# Patient Record
Sex: Female | Born: 1995 | Race: Black or African American | Hispanic: No | Marital: Single | Smoking: Never smoker
Health system: Southern US, Community
[De-identification: ages and names within clinical notes are randomized; demographics above are authoritative.]

## PROBLEM LIST (undated history)

## (undated) DIAGNOSIS — S82142A Displaced bicondylar fracture of left tibia, initial encounter for closed fracture: Secondary | ICD-10-CM

## (undated) DIAGNOSIS — H539 Unspecified visual disturbance: Secondary | ICD-10-CM

## (undated) DIAGNOSIS — O139 Gestational [pregnancy-induced] hypertension without significant proteinuria, unspecified trimester: Secondary | ICD-10-CM

## (undated) DIAGNOSIS — T7840XA Allergy, unspecified, initial encounter: Secondary | ICD-10-CM

## (undated) DIAGNOSIS — S82122A Displaced fracture of lateral condyle of left tibia, initial encounter for closed fracture: Secondary | ICD-10-CM

## (undated) HISTORY — DX: Gestational (pregnancy-induced) hypertension without significant proteinuria, unspecified trimester: O13.9

## (undated) HISTORY — PX: MYRINGOTOMY WITH TUBE PLACEMENT: SHX5663

---
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
## 2016-04-17 IMAGING — RF DG C-ARM 61-120 MIN
1 series · 4 of 4 positions shown · non-contrast
Comparison: Left knee series of November 04, 2014.

CLINICAL DATA: ORIF for tibial plateau fracture. Fluoro time
reported as 46 seconds

EXAM:
DG C-ARM 61-120 MIN; LEFT KNEE - 1-2 VIEW

[Series 1: run · 4 of 4 slices shown]
[im 1/4]
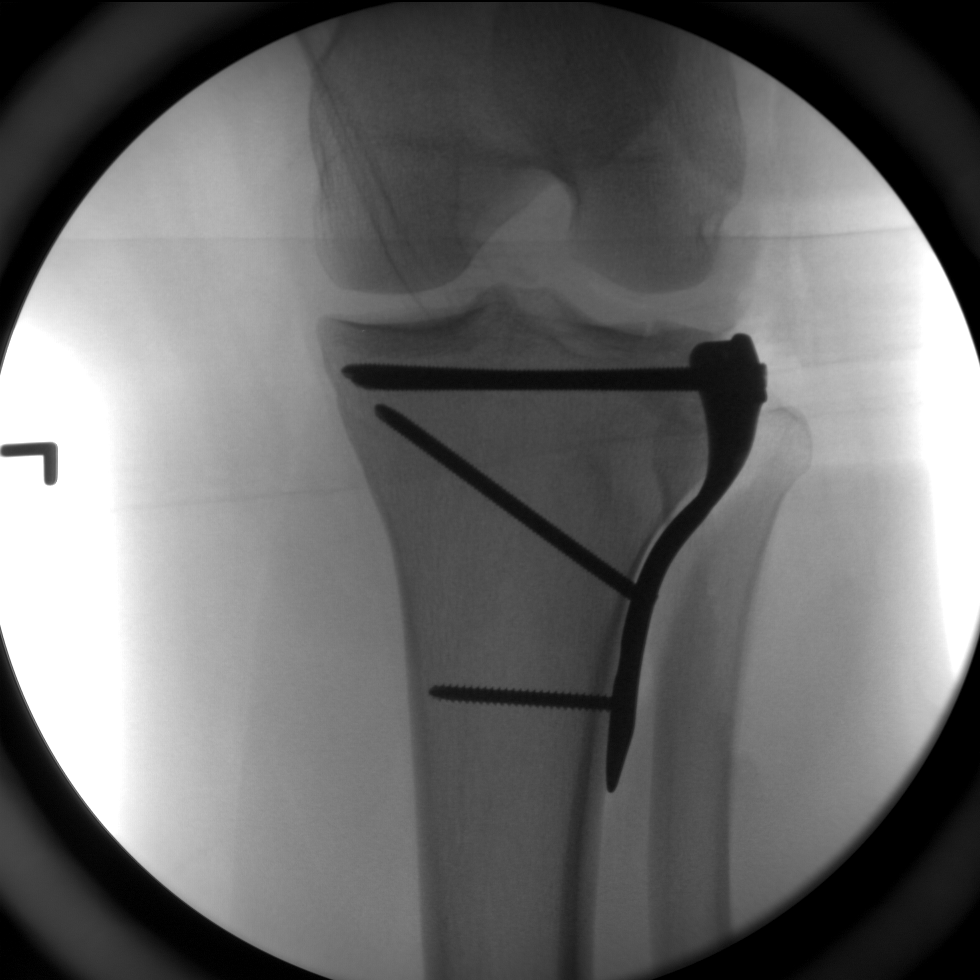
[im 2/4]
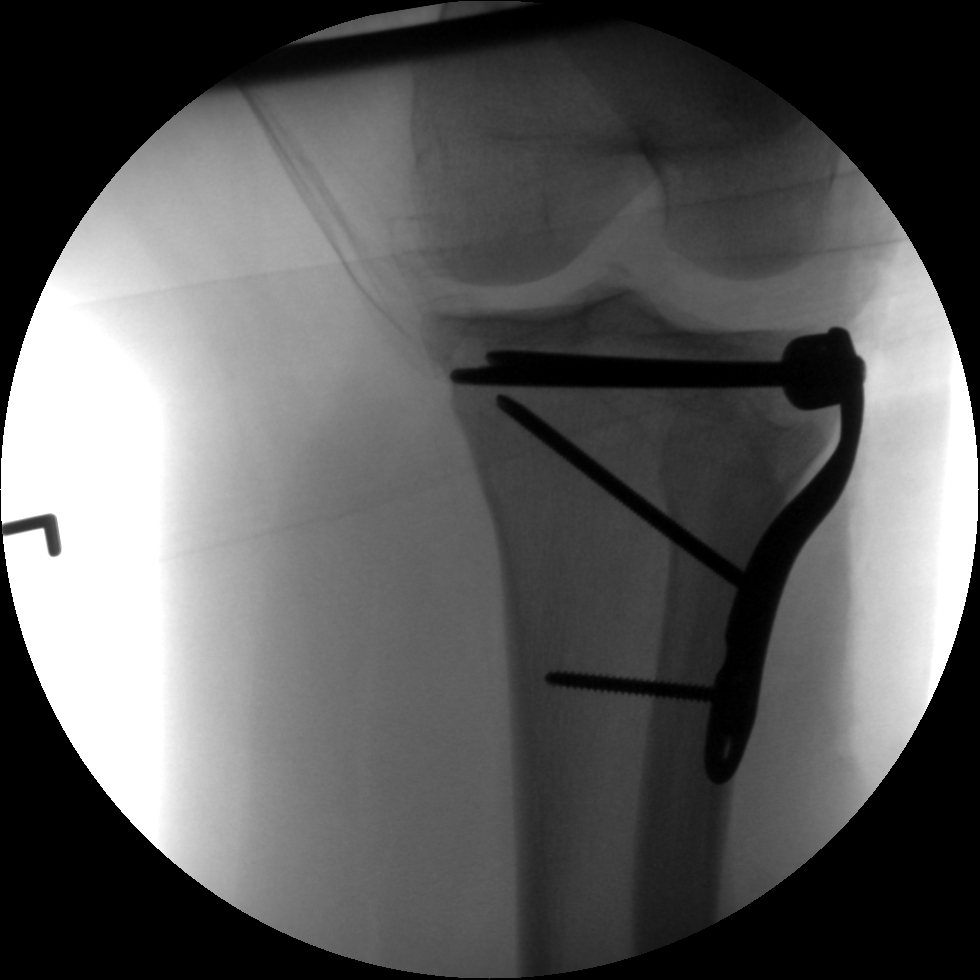
[im 3/4]
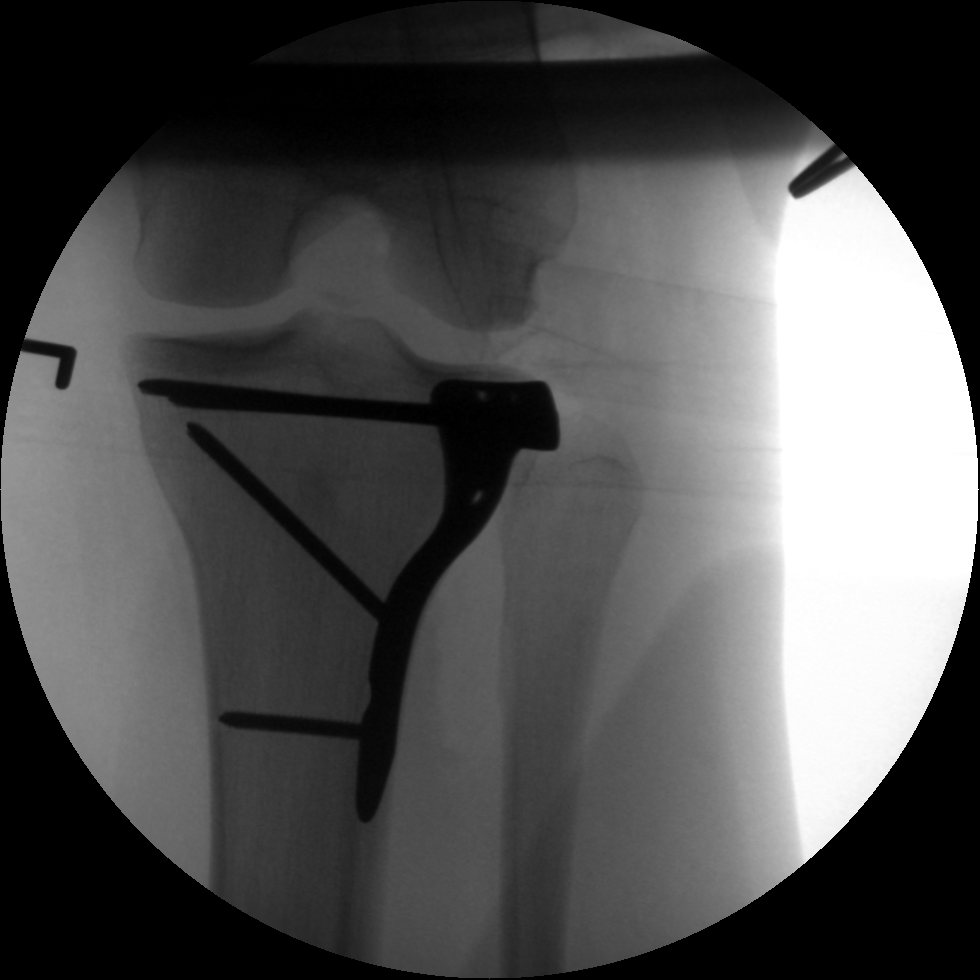
[im 4/4]
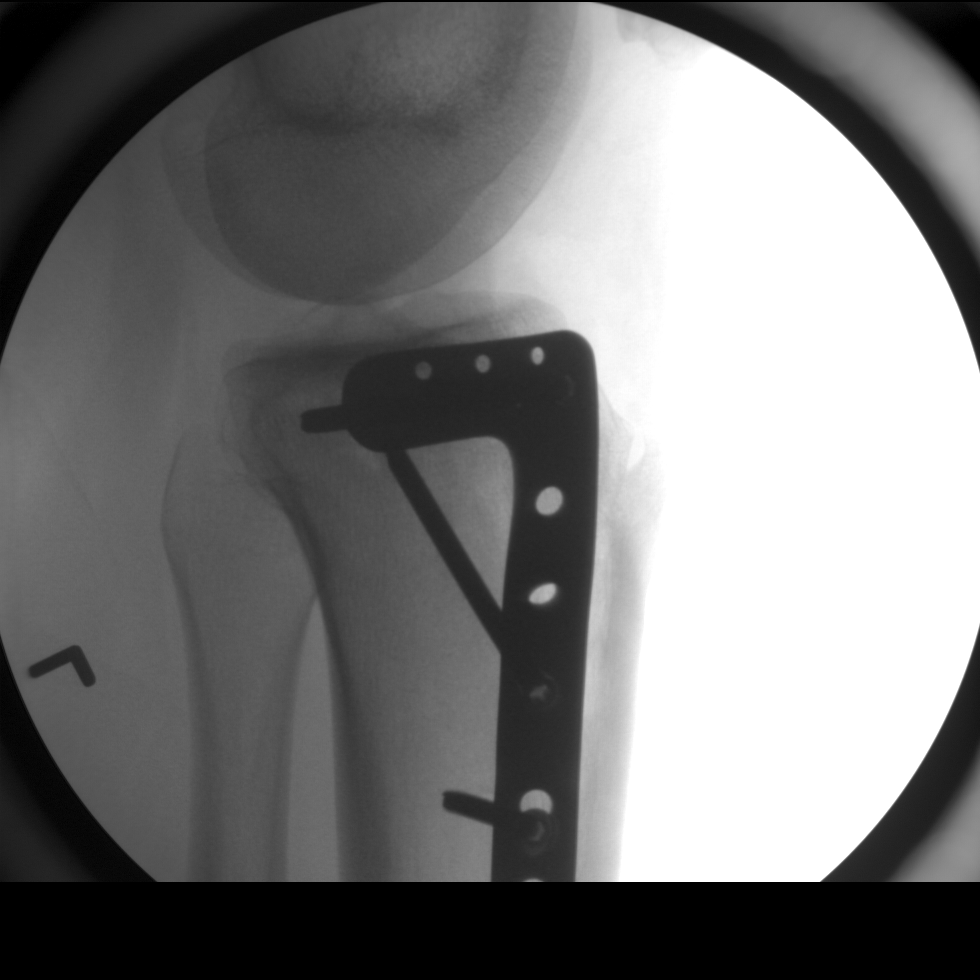

[4 of 4 positions shown; findings below may reference images not displayed]

FINDINGS: Four fluorospot images reveal the patient to have undergone ORIF for
a lateral tibial plateau fracture. Elevation of the tibial plateau
is near anatomic. The metallic hardware is in appropriate position
radiographically.
IMPRESSION: The patient has undergone ORIF for a lateral tibial plateau
fracture. There is no immediate postprocedure complication.

## 2016-08-26 ENCOUNTER — Emergency Department (HOSPITAL_COMMUNITY)
Admission: EM | Admit: 2016-08-26 | Discharge: 2016-08-26 | Disposition: A | Discharge status: Home / Self Care | Payer: Medicaid Other | Attending: Emergency Medicine | Admitting: Emergency Medicine

## 2016-08-26 ENCOUNTER — Encounter (HOSPITAL_COMMUNITY): Payer: Self-pay

## 2016-08-26 DIAGNOSIS — L509 Urticaria, unspecified: Secondary | ICD-10-CM | POA: Insufficient documentation

## 2016-08-26 DIAGNOSIS — Z79899 Other long term (current) drug therapy: Secondary | ICD-10-CM | POA: Insufficient documentation

## 2016-08-26 MED ORDER — DIPHENHYDRAMINE HCL 25 MG PO CAPS
25.0000 mg | ORAL_CAPSULE | Freq: Once | ORAL | Status: AC
  Administered 2016-08-26: 25 mg via ORAL
  Filled 2016-08-26: qty 1

## 2016-08-26 MED ORDER — DIPHENHYDRAMINE HCL 25 MG PO TABS: 25.0000 mg | ORAL_TABLET | Freq: Four times a day (QID) | ORAL | 0 refills | Status: DC | PRN

## 2016-08-26 MED ORDER — HYDROCORTISONE 2.5 % EX LOTN: TOPICAL_LOTION | Freq: Two times a day (BID) | CUTANEOUS | 1 refills | Status: DC

## 2016-08-26 MED ORDER — HYDROXYZINE HCL 10 MG PO TABS: 10.0000 mg | ORAL_TABLET | Freq: Four times a day (QID) | ORAL | 0 refills | Status: DC | PRN

## 2016-08-26 NOTE — ED Provider Notes (Signed)
[facility] DEPT Provider Note   CSN: [id number] Arrival date & time: 08/26/16  1152     History   Chief Complaint Chief Complaint  Patient presents with  . Rash    HPI Meghan Burton is a 20 y.o. female.  Meghan Burton is a 20 y.o. Female who presents to the ED complaining of a rash to her bilateral legs since yesterday. Patient reports she first noticed the itchy rash to her bilateral legs yesterday. She reports now that she has some areas to her abdomen and her back. She reports she recently changed her laundry detergent gain about a week ago. She reports the rash is itchy. She is use some hydrocortisone lotion with some relief of the itching. No fevers. No discharge from the site. No changes to her soaps, lotions, perfumes, plants or animals in the home. She denies active with other symptoms at home. No itching between her webspaces. She denies fevers, numbness, tingling, tongue swelling, lip swelling, trouble swallowing, body aches, insect bites, shortness of breath, abdominal pain, nausea, vomiting or coughing.   The history is provided by the patient. No language interpreter was used.  Rash      Past Medical History:  Diagnosis Date  . Allergy    seasonal  . Closed fracture of lateral portion of left tibial plateau 11/13/2014  . Tibial plateau fracture, left   . Vision abnormalities    glasses    Patient Active Problem List   Diagnosis Date Noted  . Closed fracture of lateral portion of left tibial plateau 11/13/2014  . Fracture, tibial plateau 11/13/2014    Past Surgical History:  Procedure Laterality Date  . MYRINGOTOMY WITH TUBE PLACEMENT    . ORIF TIBIA PLATEAU Left 11/13/2014   Procedure: OPEN REDUCTION INTERNAL FIXATION (ORIF) LEFT LATERAL TIBIAL PLATEAU;  Surgeon: Eulas Post, MD;  Location: [facility];  Service: Orthopedics;  Laterality: Left;    OB History    No data available       Home Medications    Prior to Admission  medications   Medication Sig Start Date End Date Taking? Authorizing Provider  baclofen (LIORESAL) 10 MG tablet Take 1 tablet (10 mg total) by mouth 3 (three) times daily. As needed for muscle spasm 11/13/14   Teryl Lucy, MD  diphenhydrAMINE (BENADRYL) 25 MG tablet Take 1 tablet (25 mg total) by mouth every 6 (six) hours as needed for itching (Rash). 08/26/16   Everlene Farrier, PA-C  hydrocortisone 2.5 % lotion Apply topically 2 (two) times daily. 08/26/16   Everlene Farrier, PA-C  hydrOXYzine (ATARAX/VISTARIL) 10 MG tablet Take 1 tablet (10 mg total) by mouth every 6 (six) hours as needed for itching. 08/26/16   Everlene Farrier, PA-C  ondansetron (ZOFRAN) 4 MG tablet Take 1 tablet (4 mg total) by mouth every 8 (eight) hours as needed for nausea or vomiting. 11/13/14   Teryl Lucy, MD  oxyCODONE-acetaminophen (PERCOCET) 10-325 MG per tablet Take 1-2 tablets by mouth every 6 (six) hours as needed for pain. MAXIMUM TOTAL ACETAMINOPHEN DOSE IS 4000 MG PER DAY 11/13/14   Teryl Lucy, MD  sennosides-docusate sodium (SENOKOT-S) 8.6-50 MG tablet Take 2 tablets by mouth daily. 11/13/14   Teryl Lucy, MD    Family History No family history on file.  Social History Social History  Substance Use Topics  . Smoking status: Never Smoker  . Smokeless tobacco: Never Used  . Alcohol use No     Allergies   Review of patient's allergies  indicates no known allergies.   Review of Systems Review of Systems  Constitutional: Negative for fever.  HENT: Negative for drooling, mouth sores, sore throat and trouble swallowing.   Respiratory: Negative for cough, shortness of breath and wheezing.   Gastrointestinal: Negative for abdominal pain, nausea and vomiting.  Skin: Positive for rash.     Physical Exam Updated Vital Signs BP 139/57 (BP Location: Left Arm)   Pulse 111   Temp 98.7 F (37.1 C) (Oral)   Resp 18   Ht 5\' 7"  (1.702 m)   Wt 86.2 kg   LMP 08/10/2016 Comment: implant  SpO2 100%   BMI 29.76  kg/m   Physical Exam  Constitutional: She appears well-developed and well-nourished. No distress.  Nontoxic appearing.  HENT:  Head: Normocephalic and atraumatic.  Right Ear: External ear normal.  Left Ear: External ear normal.  Mouth/Throat: Oropharynx is clear and moist.  No tongue or lip swelling. Uvula is midline without edema. No drooling.  Eyes: Conjunctivae are normal. Pupils are equal, round, and reactive to light. Right eye exhibits no discharge. Left eye exhibits no discharge.  Neck: Neck supple.  Cardiovascular: Normal rate, regular rhythm, normal heart sounds and intact distal pulses.   Heart rate is 88.  Pulmonary/Chest: Effort normal and breath sounds normal. No respiratory distress. She has no wheezes. She has no rales.  Lungs clear auscultation bilaterally.  Abdominal: Soft. There is no tenderness.  Lymphadenopathy:    She has no cervical adenopathy.  Neurological: She is alert. Coordination normal.  Skin: Skin is warm and dry. Capillary refill takes less than 2 seconds. Rash noted. She is not diaphoretic. No erythema. No pallor.  Scattered urticarial rash noted mostly to her bilateral legs. There is a small area on her abdomen and back. No vesicles or bulla. No discharge. No induration. No petechiae or purpura. No target lesions.  Psychiatric: She has a normal mood and affect. Her behavior is normal.  Nursing note and vitals reviewed.    ED Treatments / Results  Labs (all labs ordered are listed, but only abnormal results are displayed) Labs Reviewed - No data to display  EKG  EKG Interpretation None       Radiology No results found.  Procedures Procedures (including critical care time)  Medications Ordered in ED Medications  diphenhydrAMINE (BENADRYL) capsule 25 mg (not administered)     Initial Impression / Assessment and Plan / ED Course  I have reviewed the triage vital signs and the nursing notes.  Pertinent labs & imaging results that  were available during my care of the patient were reviewed by me and considered in my medical decision making (see chart for details).  Clinical Course   This is a 20 y.o. Female who presents to the ED complaining of a rash to her bilateral legs since yesterday. Patient reports she first noticed the itchy rash to her bilateral legs yesterday. She reports now that she has some areas to her abdomen and her back. She reports she recently changed her laundry detergent gain about a week ago. She reports the rash is itchy. She is use some hydrocortisone lotion with some relief of the itching. No fevers. No tongue or lip swelling. No SOB.  On exam the patient is afebrile nontoxic appearing. She has a slight urticarial rash noted to her bilateral legs mostly. Vesicles or bulla. No target lesions. No induration or fluctuance. No itching between her webspaces. Patient with an urticarial rash. I encouraged her to switch back  to her usual laundry detergent. Provided Benadryl in the emergency department. Patient discharged with prescription for Atarax, Benadryl and hydrocortisone lotion. I encouraged her to follow-up with her primary care doctor. I discussed strict return precautions. I advised the patient to follow-up with their primary care provider this week. I advised the patient to return to the emergency department with new or worsening symptoms or new concerns. The patient verbalized understanding and agreement with plan.    Final Clinical Impressions(s) / ED Diagnoses   Final diagnoses:  Urticaria    New Prescriptions New Prescriptions   DIPHENHYDRAMINE (BENADRYL) 25 MG TABLET    Take 1 tablet (25 mg total) by mouth every 6 (six) hours as needed for itching (Rash).   HYDROCORTISONE 2.5 % LOTION    Apply topically 2 (two) times daily.   HYDROXYZINE (ATARAX/VISTARIL) 10 MG TABLET    Take 1 tablet (10 mg total) by mouth every 6 (six) hours as needed for itching.     Everlene FarrierWilliam Lamere Lightner, PA-C 08/26/16  1324    Samuel JesterKathleen McManus, DO 08/27/16 [phone]

## 2016-08-26 NOTE — ED Triage Notes (Addendum)
Pt reports a rash that started on legs yesterday, now has hives to arms, legs and trunk of body. Complains of itching. Denies SOB or throat swelling

## 2016-08-26 NOTE — ED Notes (Signed)
Hives to back, buttock and legs.  Denies any SOB or mouth swelling.  Ate pizza with cheese last night.  Not on any medications except birth control implant to left upper arm, which has been there for one year.  Only changes is laundry detergent in last few days.  Was using Gain but has changed to unknown detergent.

## 2017-06-08 DIAGNOSIS — L910 Hypertrophic scar: Secondary | ICD-10-CM | POA: Diagnosis not present

## 2018-01-10 DIAGNOSIS — Z113 Encounter for screening for infections with a predominantly sexual mode of transmission: Secondary | ICD-10-CM | POA: Diagnosis not present

## 2018-01-10 DIAGNOSIS — Z114 Encounter for screening for human immunodeficiency virus [HIV]: Secondary | ICD-10-CM | POA: Diagnosis not present

## 2018-01-10 DIAGNOSIS — Z3009 Encounter for other general counseling and advice on contraception: Secondary | ICD-10-CM | POA: Diagnosis not present

## 2018-01-10 DIAGNOSIS — Z3041 Encounter for surveillance of contraceptive pills: Secondary | ICD-10-CM | POA: Diagnosis not present

## 2018-05-17 ENCOUNTER — Ambulatory Visit: Payer: 59 | Admitting: Adult Health

## 2018-05-17 ENCOUNTER — Encounter: Payer: Self-pay | Admitting: Adult Health

## 2018-05-17 VITALS — BP 126/80 | HR 92 | Ht 66.0 in | Wt 198.0 lb

## 2018-05-17 DIAGNOSIS — R109 Unspecified abdominal pain: Secondary | ICD-10-CM | POA: Diagnosis not present

## 2018-05-17 DIAGNOSIS — Z3201 Encounter for pregnancy test, result positive: Secondary | ICD-10-CM | POA: Diagnosis not present

## 2018-05-17 DIAGNOSIS — O3680X0 Pregnancy with inconclusive fetal viability, not applicable or unspecified: Secondary | ICD-10-CM

## 2018-05-17 DIAGNOSIS — Z3A01 Less than 8 weeks gestation of pregnancy: Secondary | ICD-10-CM

## 2018-05-17 DIAGNOSIS — N926 Irregular menstruation, unspecified: Secondary | ICD-10-CM

## 2018-05-17 LAB — POCT URINE PREGNANCY: Preg Test, Ur: POSITIVE — AB

## 2018-05-17 MED ORDER — PRENATAL PLUS 27-1 MG PO TABS: 1.0000 | ORAL_TABLET | Freq: Every day | ORAL | 12 refills | Status: DC

## 2018-05-17 NOTE — Patient Instructions (Signed)
First Trimester of Pregnancy The first trimester of pregnancy is from week 1 until the end of week 13 (months 1 through 3). A week after a sperm fertilizes an egg, the egg will implant on the wall of the uterus. This embryo will begin to develop into a baby. Genes from you and your partner will form the baby. The female genes will determine whether the baby will be a boy or a girl. At 6-8 weeks, the eyes and face will be formed, and the heartbeat can be seen on ultrasound. At the end of 12 weeks, all the baby's organs will be formed. Now that you are pregnant, you will want to do everything you can to have a healthy baby. Two of the most important things are to get good prenatal care and to follow your health care provider's instructions. Prenatal care is all the medical care you receive before the baby's birth. This care will help prevent, find, and treat any problems during the pregnancy and childbirth. Body changes during your first trimester Your body goes through many changes during pregnancy. The changes vary from woman to woman.  You may gain or lose a couple of pounds at first.  You may feel sick to your stomach (nauseous) and you may throw up (vomit). If the vomiting is uncontrollable, call your health care provider.  You may tire easily.  You may develop headaches that can be relieved by medicines. All medicines should be approved by your health care provider.  You may urinate more often. Painful urination may mean you have a bladder infection.  You may develop heartburn as a result of your pregnancy.  You may develop constipation because certain hormones are causing the muscles that push stool through your intestines to slow down.  You may develop hemorrhoids or swollen veins (varicose veins).  Your breasts may begin to grow larger and become tender. Your nipples may stick out more, and the tissue that surrounds them (areola) may become darker.  Your gums may bleed and may be  sensitive to brushing and flossing.  Dark spots or blotches (chloasma, mask of pregnancy) may develop on your face. This will likely fade after the baby is born.  Your menstrual periods will stop.  You may have a loss of appetite.  You may develop cravings for certain kinds of food.  You may have changes in your emotions from day to day, such as being excited to be pregnant or being concerned that something may go wrong with the pregnancy and baby.  You may have more vivid and strange dreams.  You may have changes in your hair. These can include thickening of your hair, rapid growth, and changes in texture. Some women also have hair loss during or after pregnancy, or hair that feels dry or thin. Your hair will most likely return to normal after your baby is born.  What to expect at prenatal visits During a routine prenatal visit:  You will be weighed to make sure you and the baby are growing normally.  Your blood pressure will be taken.  Your abdomen will be measured to track your baby's growth.  The fetal heartbeat will be listened to between weeks 10 and 14 of your pregnancy.  Test results from any previous visits will be discussed.  Your health care provider may ask you:  How you are feeling.  If you are feeling the baby move.  If you have had any abnormal symptoms, such as leaking fluid, bleeding, severe headaches,   or abdominal cramping.  If you are using any tobacco products, including cigarettes, chewing tobacco, and electronic cigarettes.  If you have any questions.  Other tests that may be performed during your first trimester include:  Blood tests to find your blood type and to check for the presence of any previous infections. The tests will also be used to check for low iron levels (anemia) and protein on red blood cells (Rh antibodies). Depending on your risk factors, or if you previously had diabetes during pregnancy, you may have tests to check for high blood  sugar that affects pregnant women (gestational diabetes).  Urine tests to check for infections, diabetes, or protein in the urine.  An ultrasound to confirm the proper growth and development of the baby.  Fetal screens for spinal cord problems (spina bifida) and Down syndrome.  HIV (human immunodeficiency virus) testing. Routine prenatal testing includes screening for HIV, unless you choose not to have this test.  You may need other tests to make sure you and the baby are doing well.  Follow these instructions at home: Medicines  Follow your health care provider's instructions regarding medicine use. Specific medicines may be either safe or unsafe to take during pregnancy.  Take a prenatal vitamin that contains at least 600 micrograms (mcg) of folic acid.  If you develop constipation, try taking a stool softener if your health care provider approves. Eating and drinking  Eat a balanced diet that includes fresh fruits and vegetables, whole grains, good sources of protein such as meat, eggs, or tofu, and low-fat dairy. Your health care provider will help you determine the amount of weight gain that is right for you.  Avoid raw meat and uncooked cheese. These carry germs that can cause birth defects in the baby.  Eating four or five small meals rather than three large meals a day may help relieve nausea and vomiting. If you start to feel nauseous, eating a few soda crackers can be helpful. Drinking liquids between meals, instead of during meals, also seems to help ease nausea and vomiting.  Limit foods that are high in fat and processed sugars, such as fried and sweet foods.  To prevent constipation: ? Eat foods that are high in fiber, such as fresh fruits and vegetables, whole grains, and beans. ? Drink enough fluid to keep your urine clear or pale yellow. Activity  Exercise only as directed by your health care provider. Most women can continue their usual exercise routine during  pregnancy. Try to exercise for 30 minutes at least 5 days a week. Exercising will help you: ? Control your weight. ? Stay in shape. ? Be prepared for labor and delivery.  Experiencing pain or cramping in the lower abdomen or lower back is a good sign that you should stop exercising. Check with your health care provider before continuing with normal exercises.  Try to avoid standing for long periods of time. Move your legs often if you must stand in one place for a long time.  Avoid heavy lifting.  Wear low-heeled shoes and practice good posture.  You may continue to have sex unless your health care provider tells you not to. Relieving pain and discomfort  Wear a good support bra to relieve breast tenderness.  Take warm sitz baths to soothe any pain or discomfort caused by hemorrhoids. Use hemorrhoid cream if your health care provider approves.  Rest with your legs elevated if you have leg cramps or low back pain.  If you develop   varicose veins in your legs, wear support hose. Elevate your feet for 15 minutes, 3-4 times a day. Limit salt in your diet. Prenatal care  Schedule your prenatal visits by the twelfth week of pregnancy. They are usually scheduled monthly at first, then more often in the last 2 months before delivery.  Write down your questions. Take them to your prenatal visits.  Keep all your prenatal visits as told by your health care provider. This is important. Safety  Wear your seat belt at all times when driving.  Make a list of emergency phone numbers, including numbers for family, friends, the hospital, and police and fire departments. General instructions  Ask your health care provider for a referral to a local prenatal education class. Begin classes no later than the beginning of month 6 of your pregnancy.  Ask for help if you have counseling or nutritional needs during pregnancy. Your health care provider can offer advice or refer you to specialists for help  with various needs.  Do not use hot tubs, steam rooms, or saunas.  Do not douche or use tampons or scented sanitary pads.  Do not cross your legs for long periods of time.  Avoid cat litter boxes and soil used by cats. These carry germs that can cause birth defects in the baby and possibly loss of the fetus by miscarriage or stillbirth.  Avoid all smoking, herbs, alcohol, and medicines not prescribed by your health care provider. Chemicals in these products affect the formation and growth of the baby.  Do not use any products that contain nicotine or tobacco, such as cigarettes and e-cigarettes. If you need help quitting, ask your health care provider. You may receive counseling support and other resources to help you quit.  Schedule a dentist appointment. At home, brush your teeth with a soft toothbrush and be gentle when you floss. Contact a health care provider if:  You have dizziness.  You have mild pelvic cramps, pelvic pressure, or nagging pain in the abdominal area.  You have persistent nausea, vomiting, or diarrhea.  You have a bad smelling vaginal discharge.  You have pain when you urinate.  You notice increased swelling in your face, hands, legs, or ankles.  You are exposed to fifth disease or chickenpox.  You are exposed to German measles (rubella) and have never had it. Get help right away if:  You have a fever.  You are leaking fluid from your vagina.  You have spotting or bleeding from your vagina.  You have severe abdominal cramping or pain.  You have rapid weight gain or loss.  You vomit blood or material that looks like coffee grounds.  You develop a severe headache.  You have shortness of breath.  You have any kind of trauma, such as from a fall or a car accident. Summary  The first trimester of pregnancy is from week 1 until the end of week 13 (months 1 through 3).  Your body goes through many changes during pregnancy. The changes vary from  woman to woman.  You will have routine prenatal visits. During those visits, your health care provider will examine you, discuss any test results you may have, and talk with you about how you are feeling. This information is not intended to replace advice given to you by your health care provider. Make sure you discuss any questions you have with your health care provider. Document Released: 10/20/2001 Document Revised: 10/07/2016 Document Reviewed: 10/07/2016 Elsevier Interactive Patient Education  2018 Elsevier   Inc.  

## 2018-05-17 NOTE — Progress Notes (Signed)
  Subjective:     Patient ID: Meghan Burton, female   DOB: February 19, 1996, 22 y.o.   MRN: [id number]  HPI Meghan Burton is a 22 year old black female in for UPT, has missed a period and had 2+HPTs.   Review of Systems +missed period with 2+HPTs ,cramping Reviewed past medical,surgical, social and family history. Reviewed medications and allergies.     Objective:   Physical Exam BP 126/80 (BP Location: Left Arm, Patient Position: Sitting, Cuff Size: Normal)   Pulse 92   Ht 5\' 6"  (1.676 m)   Wt 198 lb (89.8 kg)   LMP 04/13/2018   BMI 31.96 kg/m UPT + about 4+6 weeks by LMP with EDD 01/19/19.Skin warm and dry. Neck: mid line trachea, normal thyroid, good ROM, no lymphadenopathy noted. Lungs: clear to ausculation bilaterally. Cardiovascular: regular rate and rhythm. Abdomen is soft and non tender.PHQ 2 score 0.     Assessment:     1. Pregnancy examination or test, positive result   2. Less than [redacted] weeks gestation of pregnancy   3. Encounter to determine fetal viability of pregnancy, single or unspecified fetus       Plan:     Meds ordered this encounter  Medications  . prenatal vitamin w/FE, FA (PRENATAL 1 + 1) 27-1 MG TABS tablet    Sig: Take 1 tablet by mouth daily at 12 noon.    Dispense:  30 each    Refill:  12    Order Specific Question:   Supervising Provider    Answer:   Lazaro ArmsEURE, LUTHER H [[id number]]  Return in 2 weeks for dating US Review handouts on [facility] trimester and by [facility]

## 2018-05-18 NOTE — Progress Notes (Signed)
This patient [facility] not [facility] and I am not familiar with her personally.

## 2018-05-20 ENCOUNTER — Other Ambulatory Visit: Payer: Self-pay

## 2018-05-20 ENCOUNTER — Encounter: Payer: Self-pay | Admitting: Obstetrics & Gynecology

## 2018-05-20 ENCOUNTER — Ambulatory Visit: Payer: Medicaid Other | Admitting: Obstetrics & Gynecology

## 2018-05-20 VITALS — BP 129/70 | HR 95 | Ht 66.0 in | Wt 198.0 lb

## 2018-05-20 DIAGNOSIS — O200 Threatened abortion: Secondary | ICD-10-CM | POA: Diagnosis not present

## 2018-05-20 NOTE — Progress Notes (Signed)
Chief Complaint  Patient presents with  . Vaginal Bleeding    abd cramping      22 y.o. G1P0 Patient's last menstrual period was 04/13/2018. The current method of family planning is pt is pregnant.  Outpatient Encounter Medications as of 05/20/2018  Medication Sig  . prenatal vitamin w/FE, FA (PRENATAL 1 + 1) 27-1 MG TABS tablet Take 1 tablet by mouth daily at 12 noon.   No facility-administered encounter medications on file as of 05/20/2018.     Subjective Meghan Burton present stating she has had some spotting and cramping since yesterday Nothing heavy like a period and the cramps are menstrual like  Past Medical History:  Diagnosis Date  . Allergy    seasonal  . Closed fracture of lateral portion of left tibial plateau 11/13/2014  . Tibial plateau fracture, left   . Vision abnormalities    glasses    Past Surgical History:  Procedure Laterality Date  . MYRINGOTOMY WITH TUBE PLACEMENT    . ORIF TIBIA PLATEAU Left 11/13/2014   Procedure: OPEN REDUCTION INTERNAL FIXATION (ORIF) LEFT LATERAL TIBIAL PLATEAU;  Surgeon: Eulas Post, MD;  Location: [facility];  Service: Orthopedics;  Laterality: Left;    OB History    Gravida  1   Para      Term      Preterm      AB      Living        SAB      TAB      Ectopic      Multiple      Live Births              No Known Allergies  Social History   Socioeconomic History  . Marital status: Single    Spouse name: Not on file  . Number of children: Not on file  . Years of education: Not on file  . Highest education level: Not on file  Occupational History  . Not on file  Social Needs  . Financial resource strain: Not on file  . Food insecurity:    Worry: Not on file    Inability: Not on file  . Transportation needs:    Medical: Not on file    Non-medical: Not on file  Tobacco Use  . Smoking status: Never Smoker  . Smokeless tobacco: Never Used  Substance and Sexual  Activity  . Alcohol use: No  . Drug use: No  . Sexual activity: Not Currently    Birth control/protection: None  Lifestyle  . Physical activity:    Days per week: Not on file    Minutes per session: Not on file  . Stress: Not on file  Relationships  . Social connections:    Talks on phone: Not on file    Gets together: Not on file    Attends religious service: Not on file    Active member of club or organization: Not on file    Attends meetings of clubs or organizations: Not on file    Relationship status: Not on file  Other Topics Concern  . Not on file  Social History Narrative  . Not on file    Family History  Problem Relation Age of Onset  . Thyroid disease Maternal Grandmother   . Hypertension Maternal Grandmother   . Hypertension Mother     Medications:       Current Outpatient Medications:  .  prenatal vitamin  w/FE, FA (PRENATAL 1 + 1) 27-1 MG TABS tablet, Take 1 tablet by mouth daily at 12 noon., Disp: 30 each, Rfl: 12  Objective Blood pressure 129/70, pulse 95, height 5\' 6"  (1.676 m), weight 198 lb (89.8 kg), last menstrual period 04/13/2018.  Gen WDWN no distress Abdominal exam is normal non tender Vagina no blood in the vault Bimanual is negative   Pertinent ROS No burning with urination, frequency or urgency No nausea, vomiting or diarrhea Nor fever chills or other constitutional symptoms   Labs or studies pending    Impression Diagnoses this Encounter::   ICD-10-CM   1. Threatened abortion O20.0 Beta hCG quant (ref lab)    Established relevant diagnosis(es):   Plan/Recommendations: No orders of the defined types were placed in this encounter.   Labs or Scans Ordered: Orders Placed This Encounter  Procedures  . Beta hCG quant (ref lab)    Management:: Check HCG today and base follow up on that study  Follow up Return if symptoms worsen or fail to improve, for will contact Monday with HCG level and follow up based on  that.     All questions were answered.

## 2018-05-21 LAB — BETA HCG QUANT (REF LAB): hCG Quant: 6829 m[IU]/mL

## 2018-05-23 ENCOUNTER — Telehealth: Payer: Self-pay | Admitting: *Deleted

## 2018-05-23 ENCOUNTER — Other Ambulatory Visit: Payer: Self-pay | Admitting: Adult Health

## 2018-05-23 ENCOUNTER — Other Ambulatory Visit: Payer: Self-pay | Admitting: Obstetrics & Gynecology

## 2018-05-23 ENCOUNTER — Other Ambulatory Visit (INDEPENDENT_AMBULATORY_CARE_PROVIDER_SITE_OTHER): Payer: 59

## 2018-05-23 DIAGNOSIS — O3680X0 Pregnancy with inconclusive fetal viability, not applicable or unspecified: Secondary | ICD-10-CM

## 2018-05-23 DIAGNOSIS — Z3A01 Less than 8 weeks gestation of pregnancy: Secondary | ICD-10-CM

## 2018-05-23 NOTE — Progress Notes (Signed)
US TA/TV:arcuate uterus,IUP in left horn,6+2 wks GS w/ys, fetal pole 1.8 mm,fhr 97 bpm,normal ovaries bilat,limited view,pt will come back for f/u ultrasound.

## 2018-05-23 NOTE — Telephone Encounter (Signed)
Patient notified of HCG and Dr Forestine ChuteEure's request for her to come for u/s today or tomorrow.  Pt to come this afternoon.

## 2018-05-26 ENCOUNTER — Telehealth: Payer: Self-pay | Admitting: Advanced Practice Midwife

## 2018-05-26 NOTE — Telephone Encounter (Signed)
Having mourning sickness can she get something called in to [facility] ,

## 2018-05-27 ENCOUNTER — Other Ambulatory Visit: Payer: Self-pay | Admitting: Obstetrics & Gynecology

## 2018-05-27 MED ORDER — PROMETHAZINE HCL 25 MG PO TABS: 25.0000 mg | ORAL_TABLET | Freq: Four times a day (QID) | ORAL | 1 refills | Status: DC | PRN

## 2018-05-27 NOTE — Telephone Encounter (Signed)
LMOVM that request for nausea medication is being sent to a provider. Advised that she could check with her pharmacy later today.

## 2018-05-27 NOTE — Telephone Encounter (Signed)
E prescribed phenergan

## 2018-05-30 ENCOUNTER — Other Ambulatory Visit: Payer: Self-pay | Admitting: Women's Health

## 2018-05-30 MED ORDER — DOXYLAMINE-PYRIDOXINE 10-10 MG PO TBEC: DELAYED_RELEASE_TABLET | ORAL | 6 refills | Status: DC

## 2018-05-31 ENCOUNTER — Other Ambulatory Visit: Payer: Medicaid Other

## 2018-06-06 ENCOUNTER — Other Ambulatory Visit: Payer: Self-pay | Admitting: Obstetrics & Gynecology

## 2018-06-06 DIAGNOSIS — O3680X0 Pregnancy with inconclusive fetal viability, not applicable or unspecified: Secondary | ICD-10-CM

## 2018-06-07 ENCOUNTER — Ambulatory Visit (INDEPENDENT_AMBULATORY_CARE_PROVIDER_SITE_OTHER): Payer: 59

## 2018-06-07 DIAGNOSIS — O3680X0 Pregnancy with inconclusive fetal viability, not applicable or unspecified: Secondary | ICD-10-CM

## 2018-06-07 NOTE — Progress Notes (Signed)
US 7+6 wks,single IUP w/ys,positive fht 160 bpm,normal ovaries bilat,crl 16.69 mm,small subchorionic hemorrhage 2.2 x  1.8 x .9 cm

## 2018-07-05 ENCOUNTER — Encounter (INDEPENDENT_AMBULATORY_CARE_PROVIDER_SITE_OTHER): Payer: Self-pay

## 2018-07-05 ENCOUNTER — Other Ambulatory Visit: Payer: Medicaid Other

## 2018-07-05 ENCOUNTER — Other Ambulatory Visit: Payer: Self-pay | Admitting: Obstetrics & Gynecology

## 2018-07-05 ENCOUNTER — Other Ambulatory Visit (HOSPITAL_COMMUNITY)
Admission: RE | Admit: 2018-07-05 | Discharge: 2018-07-05 | Disposition: A | Discharge status: Home / Self Care | Payer: Medicaid Other | Source: Ambulatory Visit | Attending: Advanced Practice Midwife | Admitting: Advanced Practice Midwife

## 2018-07-05 ENCOUNTER — Ambulatory Visit: Payer: Medicaid Other | Admitting: *Deleted

## 2018-07-05 ENCOUNTER — Ambulatory Visit (INDEPENDENT_AMBULATORY_CARE_PROVIDER_SITE_OTHER): Payer: Medicaid Other | Admitting: Advanced Practice Midwife

## 2018-07-05 ENCOUNTER — Encounter: Payer: Self-pay | Admitting: Advanced Practice Midwife

## 2018-07-05 VITALS — BP 126/81 | HR 96 | Wt 208.0 lb

## 2018-07-05 DIAGNOSIS — Z3401 Encounter for supervision of normal first pregnancy, first trimester: Secondary | ICD-10-CM | POA: Diagnosis not present

## 2018-07-05 DIAGNOSIS — Z3A11 11 weeks gestation of pregnancy: Secondary | ICD-10-CM

## 2018-07-05 DIAGNOSIS — Z124 Encounter for screening for malignant neoplasm of cervix: Secondary | ICD-10-CM

## 2018-07-05 DIAGNOSIS — Z3682 Encounter for antenatal screening for nuchal translucency: Secondary | ICD-10-CM

## 2018-07-05 DIAGNOSIS — Z1389 Encounter for screening for other disorder: Secondary | ICD-10-CM

## 2018-07-05 DIAGNOSIS — O3680X0 Pregnancy with inconclusive fetal viability, not applicable or unspecified: Secondary | ICD-10-CM

## 2018-07-05 DIAGNOSIS — O0990 Supervision of high risk pregnancy, unspecified, unspecified trimester: Secondary | ICD-10-CM | POA: Insufficient documentation

## 2018-07-05 DIAGNOSIS — Z331 Pregnant state, incidental: Secondary | ICD-10-CM

## 2018-07-05 LAB — POCT URINALYSIS DIPSTICK OB
Blood, UA: NEGATIVE
GLUCOSE, UA: NEGATIVE
Ketones, UA: NEGATIVE
LEUKOCYTES UA: NEGATIVE
NITRITE UA: NEGATIVE
POC,PROTEIN,UA: NEGATIVE

## 2018-07-05 NOTE — Progress Notes (Signed)
INITIAL OBSTETRICAL VISIT Patient name: Meghan Burton MRN [id number]  Date of birth: May 31, 1996 Chief Complaint:   Initial Prenatal Visit (nausea)  History of Present Illness:   Meghan Burton is a 22 y.o. G1P0 African American female at [redacted]w[redacted]d by LMP/8 week [place] with an Estimated Date of Delivery: 01/18/19 being seen today for her initial obstetrical visit.   Her obstetrical history is significant for first baby.   Today she reports still some nausea, getting better.  Patient's last menstrual period was 04/13/2018. Last pap never Review of Systems:   Pertinent items are noted in HPI Denies cramping/contractions, leakage of fluid, vaginal bleeding, abnormal vaginal discharge w/ itching/odor/irritation, headaches, visual changes, shortness of breath, chest pain, abdominal pain, severe nausea/vomiting, or problems with urination or bowel movements unless otherwise stated above.  Pertinent History Reviewed:  Reviewed past medical,surgical, social, obstetrical and family history.  Reviewed problem list, medications and allergies. OB History  Gravida Para Term Preterm AB Living  1            SAB TAB Ectopic Multiple Live Births               # Outcome Date GA Lbr Len/2nd Weight Sex Delivery Anes PTL Lv  1 Current            Physical Assessment:   Vitals:   07/05/18 1355  BP: 126/81  Pulse: 96  Weight: 208 lb (94.3 kg)  Body mass index is 33.57 kg/m.       Physical Examination:  General appearance - well appearing, and in no distress  Mental status - alert, oriented to person, place, and time  Psych:  She has a normal mood and affect  Skin - warm and dry, normal color, no suspicious lesions noted  Chest - effort normal, all lung fields clear to auscultation bilaterally  Heart - normal rate and regular rhythm  Abdomen - soft, nontender  Extremities:  No swelling or varicosities noted  Pelvic - VULVA: normal appearing vulva with no masses, tenderness or lesions  VAGINA: normal  appearing vagina with normal color and discharge, no lesions  CERVIX: normal appearing cervix without discharge or lesions, no CMT  Thin prep pap is done w/o HR HPV cotesting     Results for orders placed or performed in visit on 07/05/18 (from the past 24 hour(s))  POC Urinalysis Dipstick OB   Collection Time: 07/05/18  2:02 PM  Result Value Ref Range   Color, UA     Clarity, UA     Glucose, UA Negative Negative   Bilirubin, UA     Ketones, UA neg    Spec Grav, UA     Blood, UA neg    pH, UA     POC Protein UA Negative Negative, Trace   Urobilinogen, UA     Nitrite, UA neg    Leukocytes, UA Negative Negative   Appearance     Odor      Assessment & Plan:  1) Low-Risk Pregnancy G1P0 at [redacted]w[redacted]d with an Estimated Date of Delivery: 01/18/19   2) Initial OB visit    Meds: No orders of the defined types were placed in this encounter.   Initial labs obtained Continue prenatal vitamins Reviewed n/v relief measures and warning s/s to report Reviewed recommended weight gain based on pre-gravid BMI Encouraged well-balanced diet Genetic Screening discussed Integrated Screen: requested Cystic fibrosis screening discussed declined Ultrasound discussed; fetal survey: requested CCNC completed  Follow-up: Return in about 2 weeks (  around 07/19/2018) for LROB, US:NT+1st IT.   Orders Placed This Encounter  Procedures  . Urine Culture  . Urinalysis, Routine w reflex microscopic  . Sickle cell screen  . Obstetric Panel, Including HIV  . Pain Management Screening Profile (10S)  . POC Urinalysis Dipstick OB    Jacklyn ShellFrances Cresenzo-Dishmon CNM, [facility] Hospital[facility]NP-BC 07/05/2018 2:29 PM

## 2018-07-05 NOTE — Patient Instructions (Signed)
 First Trimester of Pregnancy The first trimester of pregnancy is from week 1 until the end of week 12 (months 1 through 3). A week after a sperm fertilizes an egg, the egg will implant on the wall of the uterus. This embryo will begin to develop into a baby. Genes from you and your partner are forming the baby. The female genes determine whether the baby is a boy or a girl. At 6-8 weeks, the eyes and face are formed, and the heartbeat can be seen on ultrasound. At the end of 12 weeks, all the baby's organs are formed.  Now that you are pregnant, you will want to do everything you can to have a healthy baby. Two of the most important things are to get good prenatal care and to follow your health care provider's instructions. Prenatal care is all the medical care you receive before the baby's birth. This care will help prevent, find, and treat any problems during the pregnancy and childbirth. BODY CHANGES Your body goes through many changes during pregnancy. The changes vary from woman to woman.   You may gain or lose a couple of pounds at first.  You may feel sick to your stomach (nauseous) and throw up (vomit). If the vomiting is uncontrollable, call your health care provider.  You may tire easily.  You may develop headaches that can be relieved by medicines approved by your health care provider.  You may urinate more often. Painful urination may mean you have a bladder infection.  You may develop heartburn as a result of your pregnancy.  You may develop constipation because certain hormones are causing the muscles that push waste through your intestines to slow down.  You may develop hemorrhoids or swollen, bulging veins (varicose veins).  Your breasts may begin to grow larger and become tender. Your nipples may stick out more, and the tissue that surrounds them (areola) may become darker.  Your gums may bleed and may be sensitive to brushing and flossing.  Dark spots or blotches  (chloasma, mask of pregnancy) may develop on your face. This will likely fade after the baby is born.  Your menstrual periods will stop.  You may have a loss of appetite.  You may develop cravings for certain kinds of food.  You may have changes in your emotions from day to day, such as being excited to be pregnant or being concerned that something may go wrong with the pregnancy and baby.  You may have more vivid and strange dreams.  You may have changes in your hair. These can include thickening of your hair, rapid growth, and changes in texture. Some women also have hair loss during or after pregnancy, or hair that feels dry or thin. Your hair will most likely return to normal after your baby is born. WHAT TO EXPECT AT YOUR PRENATAL VISITS During a routine prenatal visit:  You will be weighed to make sure you and the baby are growing normally.  Your blood pressure will be taken.  Your abdomen will be measured to track your baby's growth.  The fetal heartbeat will be listened to starting around week 10 or 12 of your pregnancy.  Test results from any previous visits will be discussed. Your health care provider may ask you:  How you are feeling.  If you are feeling the baby move.  If you have had any abnormal symptoms, such as leaking fluid, bleeding, severe headaches, or abdominal cramping.  If you have any questions. Other   tests that may be performed during your first trimester include:  Blood tests to find your blood type and to check for the presence of any previous infections. They will also be used to check for low iron levels (anemia) and Rh antibodies. Later in the pregnancy, blood tests for diabetes will be done along with other tests if problems develop.  Urine tests to check for infections, diabetes, or protein in the urine.  An ultrasound to confirm the proper growth and development of the baby.  An amniocentesis to check for possible genetic problems.  Fetal  screens for spina bifida and Down syndrome.  You may need other tests to make sure you and the baby are doing well. HOME CARE INSTRUCTIONS  Medicines  Follow your health care provider's instructions regarding medicine use. Specific medicines may be either safe or unsafe to take during pregnancy.  Take your prenatal vitamins as directed.  If you develop constipation, try taking a stool softener if your health care provider approves. Diet  Eat regular, well-balanced meals. Choose a variety of foods, such as meat or vegetable-based protein, fish, milk and low-fat dairy products, vegetables, fruits, and whole grain breads and cereals. Your health care provider will help you determine the amount of weight gain that is right for you.  Avoid raw meat and uncooked cheese. These carry germs that can cause birth defects in the baby.  Eating four or five small meals rather than three large meals a day may help relieve nausea and vomiting. If you start to feel nauseous, eating a few soda crackers can be helpful. Drinking liquids between meals instead of during meals also seems to help nausea and vomiting.  If you develop constipation, eat more high-fiber foods, such as fresh vegetables or fruit and whole grains. Drink enough fluids to keep your urine clear or pale yellow. Activity and Exercise  Exercise only as directed by your health care provider. Exercising will help you:  Control your weight.  Stay in shape.  Be prepared for labor and delivery.  Experiencing pain or cramping in the lower abdomen or low back is a good sign that you should stop exercising. Check with your health care provider before continuing normal exercises.  Try to avoid standing for long periods of time. Move your legs often if you must stand in one place for a long time.  Avoid heavy lifting.  Wear low-heeled shoes, and practice good posture.  You may continue to have sex unless your health care provider directs you  otherwise. Relief of Pain or Discomfort  Wear a good support bra for breast tenderness.   Take warm sitz baths to soothe any pain or discomfort caused by hemorrhoids. Use hemorrhoid cream if your health care provider approves.   Rest with your legs elevated if you have leg cramps or low back pain.  If you develop varicose veins in your legs, wear support hose. Elevate your feet for 15 minutes, 3-4 times a day. Limit salt in your diet. Prenatal Care  Schedule your prenatal visits by the twelfth week of pregnancy. They are usually scheduled monthly at first, then more often in the last 2 months before delivery.  Write down your questions. Take them to your prenatal visits.  Keep all your prenatal visits as directed by your health care provider. Safety  Wear your seat belt at all times when driving.  Make a list of emergency phone numbers, including numbers for family, friends, the hospital, and police and fire departments. General   Tips  Ask your health care provider for a referral to a local prenatal education class. Begin classes no later than at the beginning of month 6 of your pregnancy.  Ask for help if you have counseling or nutritional needs during pregnancy. Your health care provider can offer advice or refer you to specialists for help with various needs.  Do not use hot tubs, steam rooms, or saunas.  Do not douche or use tampons or scented sanitary pads.  Do not cross your legs for long periods of time.  Avoid cat litter boxes and soil used by cats. These carry germs that can cause birth defects in the baby and possibly loss of the fetus by miscarriage or stillbirth.  Avoid all smoking, herbs, alcohol, and medicines not prescribed by your health care provider. Chemicals in these affect the formation and growth of the baby.  Schedule a dentist appointment. At home, brush your teeth with a soft toothbrush and be gentle when you floss. SEEK MEDICAL CARE IF:   You have  dizziness.  You have mild pelvic cramps, pelvic pressure, or nagging pain in the abdominal area.  You have persistent nausea, vomiting, or diarrhea.  You have a bad smelling vaginal discharge.  You have pain with urination.  You notice increased swelling in your face, hands, legs, or ankles. SEEK IMMEDIATE MEDICAL CARE IF:   You have a fever.  You are leaking fluid from your vagina.  You have spotting or bleeding from your vagina.  You have severe abdominal cramping or pain.  You have rapid weight gain or loss.  You vomit blood or material that looks like coffee grounds.  You are exposed to German measles and have never had them.  You are exposed to fifth disease or chickenpox.  You develop a severe headache.  You have shortness of breath.  You have any kind of trauma, such as from a fall or a car accident. Document Released: 10/20/2001 Document Revised: 03/12/2014 Document Reviewed: 09/05/2013 [employer] Patient Information 2015 [employer]. This information is not intended to replace advice given to you by your health care provider. Make sure you discuss any questions you have with your health care provider.   Nausea & Vomiting  Have saltine crackers or pretzels by your bed and eat a few bites before you raise your head out of bed in the morning  Eat small frequent meals throughout the day instead of large meals  Drink plenty of fluids throughout the day to stay hydrated, just don't drink a lot of fluids with your meals.  This can make your stomach fill up faster making you feel sick  Do not brush your teeth right after you eat  Products with real ginger are good for nausea, like ginger ale and ginger hard candy Make sure it says made with real ginger!  Sucking on sour candy like lemon heads is also good for nausea  If your prenatal vitamins make you nauseated, take them at night so you will sleep through the nausea  Sea Bands  If you feel like you need  medicine for the nausea & vomiting please let us know  If you are unable to keep any fluids or food down please let us know   Constipation  Drink plenty of fluid, preferably water, throughout the day  Eat foods high in fiber such as fruits, vegetables, and grains  Exercise, such as walking, is a good way to keep your bowels regular  Drink warm fluids, especially warm   prune juice, or decaf coffee  Eat a 1/2 cup of real oatmeal (not instant), 1/2 cup applesauce, and 1/2-1 cup warm prune juice every day  If needed, you may take Colace (docusate sodium) stool softener once or twice a day to help keep the stool soft. If you are pregnant, wait until you are out of your first trimester (12-14 weeks of pregnancy)  If you still are having problems with constipation, you may take Miralax once daily as needed to help keep your bowels regular.  If you are pregnant, wait until you are out of your first trimester (12-14 weeks of pregnancy)  Safe Medications in Pregnancy   Acne: Benzoyl Peroxide Salicylic Acid  Backache/Headache: Tylenol: 2 regular strength every 4 hours OR              2 Extra strength every 6 hours  Colds/Coughs/Allergies: Benadryl (alcohol free) 25 mg every 6 hours as needed Breath right strips Claritin Cepacol throat lozenges Chloraseptic throat spray Cold-Eeze- up to three times per day Cough drops, alcohol free Flonase (by prescription only) Guaifenesin Mucinex Robitussin DM (plain only, alcohol free) Saline nasal spray/drops Sudafed (pseudoephedrine) & Actifed ** use only after [redacted] weeks gestation and if you do not have high blood pressure Tylenol Vicks Vaporub Zinc lozenges Zyrtec   Constipation: Colace Ducolax suppositories Fleet enema Glycerin suppositories Metamucil Milk of magnesia Miralax Senokot Smooth move tea  Diarrhea: Kaopectate Imodium A-D  *NO pepto Bismol  Hemorrhoids: Anusol Anusol HC Preparation  H Tucks  Indigestion: Tums Maalox Mylanta Zantac  Pepcid  Insomnia: Benadryl (alcohol free) 25mg every 6 hours as needed Tylenol PM Unisom, no Gelcaps  Leg Cramps: Tums MagGel  Nausea/Vomiting:  Bonine Dramamine Emetrol Ginger extract Sea bands Meclizine  Nausea medication to take during pregnancy:  Unisom (doxylamine succinate 25 mg tablets) Take one tablet daily at bedtime. If symptoms are not adequately controlled, the dose can be increased to a maximum recommended dose of two tablets daily (1/2 tablet in the morning, 1/2 tablet mid-afternoon and one at bedtime). Vitamin B6 100mg tablets. Take one tablet twice a day (up to 200 mg per day).  Skin Rashes: Aveeno products Benadryl cream or 25mg every 6 hours as needed Calamine Lotion 1% cortisone cream  Yeast infection: Gyne-lotrimin 7 Monistat 7   **If taking multiple medications, please check labels to avoid duplicating the same active ingredients **take medication as directed on the label ** Do not exceed 4000 mg of tylenol in 24 hours **Do not take medications that contain aspirin or ibuprofen      

## 2018-07-06 LAB — OBSTETRIC PANEL, INCLUDING HIV
ANTIBODY SCREEN: NEGATIVE
Basophils Absolute: 0 10*3/uL (ref 0.0–0.2)
Basos: 0 %
EOS (ABSOLUTE): 0.2 10*3/uL (ref 0.0–0.4)
Eos: 3 %
HEMATOCRIT: 37.9 % (ref 34.0–46.6)
HIV SCREEN 4TH GENERATION: NONREACTIVE
Hemoglobin: 12.4 g/dL (ref 11.1–15.9)
Hepatitis B Surface Ag: NEGATIVE
Immature Grans (Abs): 0 10*3/uL (ref 0.0–0.1)
Immature Granulocytes: 0 %
LYMPHS ABS: 2.7 10*3/uL (ref 0.7–3.1)
Lymphs: 29 %
MCH: 26.1 pg — AB (ref 26.6–33.0)
MCHC: 32.7 g/dL (ref 31.5–35.7)
MCV: 80 fL (ref 79–97)
MONOS ABS: 0.7 10*3/uL (ref 0.1–0.9)
Monocytes: 8 %
NEUTROS ABS: 5.7 10*3/uL (ref 1.4–7.0)
Neutrophils: 60 %
Platelets: 270 10*3/uL (ref 150–450)
RBC: 4.75 x10E6/uL (ref 3.77–5.28)
RDW: 14.6 % (ref 12.3–15.4)
RH TYPE: POSITIVE
RPR Ser Ql: NONREACTIVE
Rubella Antibodies, IGG: 1.85 index (ref >0.99)
WBC: 9.3 10*3/uL (ref 3.4–10.8)

## 2018-07-06 LAB — PMP SCREEN PROFILE (10S), URINE
AMPHETAMINE SCREEN URINE: NEGATIVE ng/mL
BARBITURATE SCREEN URINE: NEGATIVE ng/mL
BENZODIAZEPINE SCREEN, URINE: NEGATIVE ng/mL
CANNABINOIDS UR QL SCN: POSITIVE ng/mL — AB
COCAINE(METAB.)SCREEN, URINE: NEGATIVE ng/mL
Creatinine(Crt), U: 230.5 mg/dL (ref 20.0–300.0)
Methadone Screen, Urine: NEGATIVE ng/mL
OXYCODONE+OXYMORPHONE UR QL SCN: NEGATIVE ng/mL
Opiate Scrn, Ur: NEGATIVE ng/mL
Ph of Urine: 6 (ref 4.5–8.9)
Phencyclidine Qn, Ur: NEGATIVE ng/mL
Propoxyphene Scrn, Ur: NEGATIVE ng/mL

## 2018-07-06 LAB — URINALYSIS, ROUTINE W REFLEX MICROSCOPIC
BILIRUBIN UA: NEGATIVE
Glucose, UA: NEGATIVE
LEUKOCYTES UA: NEGATIVE
Nitrite, UA: NEGATIVE
PH UA: 5.5 (ref 5.0–7.5)
PROTEIN UA: NEGATIVE
RBC UA: NEGATIVE
Specific Gravity, UA: >=1.03 — AB (ref 1.005–1.030)
Urobilinogen, Ur: 0.2 mg/dL (ref 0.2–1.0)

## 2018-07-06 LAB — SICKLE CELL SCREEN: Sickle Cell Screen: NEGATIVE

## 2018-07-07 ENCOUNTER — Other Ambulatory Visit: Payer: Self-pay | Admitting: Obstetrics & Gynecology

## 2018-07-07 LAB — URINE CULTURE

## 2018-07-08 LAB — CYTOLOGY - PAP
Chlamydia: NEGATIVE
DIAGNOSIS: NEGATIVE
Neisseria Gonorrhea: NEGATIVE

## 2018-07-19 ENCOUNTER — Ambulatory Visit (INDEPENDENT_AMBULATORY_CARE_PROVIDER_SITE_OTHER): Payer: 59 | Admitting: Advanced Practice Midwife

## 2018-07-19 ENCOUNTER — Other Ambulatory Visit: Payer: Self-pay

## 2018-07-19 ENCOUNTER — Ambulatory Visit (INDEPENDENT_AMBULATORY_CARE_PROVIDER_SITE_OTHER): Payer: 59

## 2018-07-19 ENCOUNTER — Encounter: Payer: Self-pay | Admitting: Advanced Practice Midwife

## 2018-07-19 VITALS — BP 126/67 | HR 97 | Wt 214.0 lb

## 2018-07-19 DIAGNOSIS — Z3A16 16 weeks gestation of pregnancy: Secondary | ICD-10-CM

## 2018-07-19 DIAGNOSIS — Z3481 Encounter for supervision of other normal pregnancy, first trimester: Secondary | ICD-10-CM

## 2018-07-19 DIAGNOSIS — Z3682 Encounter for antenatal screening for nuchal translucency: Secondary | ICD-10-CM | POA: Diagnosis not present

## 2018-07-19 DIAGNOSIS — Z3401 Encounter for supervision of normal first pregnancy, first trimester: Secondary | ICD-10-CM

## 2018-07-19 DIAGNOSIS — Z1389 Encounter for screening for other disorder: Secondary | ICD-10-CM

## 2018-07-19 DIAGNOSIS — Z363 Encounter for antenatal screening for malformations: Secondary | ICD-10-CM

## 2018-07-19 DIAGNOSIS — Z331 Pregnant state, incidental: Secondary | ICD-10-CM

## 2018-07-19 LAB — POCT URINALYSIS DIPSTICK OB
Glucose, UA: NEGATIVE
Ketones, UA: NEGATIVE
LEUKOCYTES UA: NEGATIVE
NITRITE UA: NEGATIVE
PROTEIN: NEGATIVE
RBC UA: NEGATIVE

## 2018-07-19 NOTE — Progress Notes (Signed)
  G1P0 [redacted]w[redacted]d Estimated Date of Delivery: 01/18/19  Blood pressure 126/67, pulse 97, weight 214 lb (97.1 kg), last menstrual period 04/13/2018.   BP weight and urine results all reviewed and noted.  Please refer to the obstetrical flow sheet for the fundal height and fetal heart rate documentation:  Patient reports good fetal movement, denies any bleeding and no rupture of membranes symptoms or regular contractions. Patient is without complaints. All questions were answered.   Physical Assessment:   Vitals:   07/19/18 0917  BP: 126/67  Pulse: 97  Weight: 214 lb (97.1 kg)  Body mass index is 34.54 kg/m.        Physical Examination:   General appearance: Well appearing, and in no distress  Mental status: Alert, oriented to person, place, and time  Skin: Warm & dry  Cardiovascular: Normal heart rate noted  Respiratory: Normal respiratory effort, no distress  Abdomen: Soft, gravid, nontender  Pelvic: Cervical exam deferred         Extremities: Edema: None  Fetal Status:         [place] 13+6 wks,crl 87.99 mm,fundal pl gr 0,normal ovaries bilat, NB present,CRL out of range for NT,152 BPM  Results for orders placed or performed in visit on 07/19/18 (from the past 24 hour(s))  POC Urinalysis Dipstick OB   Collection Time: 07/19/18  9:18 AM  Result Value Ref Range   Color, UA     Clarity, UA     Glucose, UA Negative Negative   Bilirubin, UA     Ketones, UA neg    Spec Grav, UA     Blood, UA neg    pH, UA     POC Protein UA Negative Negative, Trace   Urobilinogen, UA     Nitrite, UA neg    Leukocytes, UA Negative Negative   Appearance     Odor       Orders Placed This Encounter  Procedures  . US OB Comp + 14 Wk  . POC Urinalysis Dipstick OB    Plan:  Continued routine obstetrical care, do AFP instead of NT/IT, pt wants to wait until next visit   Return in about 1 month (around 08/18/2018) for LROB, PR:[id number].

## 2018-07-19 NOTE — Progress Notes (Signed)
[place] 13+6 wks,crl 87.99 mm,fundal pl gr 0,normal ovaries bilat, NB present,CRL out of range for NT,152 BPM

## 2018-07-19 NOTE — Patient Instructions (Signed)
Meghan Burton, I greatly value your feedback.  If you receive a survey following your visit with [place] today, we appreciate you taking the time to fill it out.  Thanks, Meghan Burton, CNM     Second Trimester of Pregnancy The second trimester is from week 14 through week 27 (months 4 through 6). The second trimester is often a time when you feel your best. Your body has adjusted to being pregnant, and you begin to feel better physically. Usually, morning sickness has lessened or quit completely, you may have more energy, and you may have an increase in appetite. The second trimester is also a time when the fetus is growing rapidly. At the end of the sixth month, the fetus is about 9 inches long and weighs about 1 pounds. You will likely begin to feel the baby move (quickening) between 16 and 20 weeks of pregnancy. Body changes during your second trimester Your body continues to go through many changes during your second trimester. The changes vary from woman to woman.  Your weight will continue to increase. You will notice your lower abdomen bulging out.  You may begin to get stretch marks on your hips, abdomen, and breasts.  You may develop headaches that can be relieved by medicines. The medicines should be approved by your health care provider.  You may urinate more often because the fetus is pressing on your bladder.  You may develop or continue to have heartburn as a result of your pregnancy.  You may develop constipation because certain hormones are causing the muscles that push waste through your intestines to slow down.  You may develop hemorrhoids or swollen, bulging veins (varicose veins).  You may have back pain. This is caused by: ? Weight gain. ? Pregnancy hormones that are relaxing the joints in your pelvis. ? A shift in weight and the muscles that support your balance.  Your breasts will continue to grow and they will continue to become tender.  Your gums may  bleed and may be sensitive to brushing and flossing.  Dark spots or blotches (chloasma, mask of pregnancy) may develop on your face. This will likely fade after the baby is born.  A dark line from your belly button to the pubic area (linea nigra) may appear. This will likely fade after the baby is born.  You may have changes in your hair. These can include thickening of your hair, rapid growth, and changes in texture. Some women also have hair loss during or after pregnancy, or hair that feels dry or thin. Your hair will most likely return to normal after your baby is born.  What to expect at prenatal visits During a routine prenatal visit:  You will be weighed to make sure you and the fetus are growing normally.  Your blood pressure will be taken.  Your abdomen will be measured to track your baby's growth.  The fetal heartbeat will be listened to.  Any test results from the previous visit will be discussed.  Your health care provider may ask you:  How you are feeling.  If you are feeling the baby move.  If you have had any abnormal symptoms, such as leaking fluid, bleeding, severe headaches, or abdominal cramping.  If you are using any tobacco products, including cigarettes, chewing tobacco, and electronic cigarettes.  If you have any questions.  Other tests that may be performed during your second trimester include:  Blood tests that check for: ? Low iron levels (anemia). ? High  blood sugar that affects pregnant women (gestational diabetes) between 42 and 28 weeks. ? Rh antibodies. This is to check for a protein on red blood cells (Rh factor).  Urine tests to check for infections, diabetes, or protein in the urine.  An ultrasound to confirm the proper growth and development of the baby.  An amniocentesis to check for possible genetic problems.  Fetal screens for spina bifida and Down syndrome.  HIV (human immunodeficiency virus) testing. Routine prenatal testing  includes screening for HIV, unless you choose not to have this test.  Follow these instructions at home: Medicines  Follow your health care provider's instructions regarding medicine use. Specific medicines may be either safe or unsafe to take during pregnancy.  Take a prenatal vitamin that contains at least 600 micrograms (mcg) of folic acid.  If you develop constipation, try taking a stool softener if your health care provider approves. Eating and drinking  Eat a balanced diet that includes fresh fruits and vegetables, whole grains, good sources of protein such as meat, eggs, or tofu, and low-fat dairy. Your health care provider will help you determine the amount of weight gain that is right for you.  Avoid raw meat and uncooked cheese. These carry germs that can cause birth defects in the baby.  If you have low calcium intake from food, talk to your health care provider about whether you should take a daily calcium supplement.  Limit foods that are high in fat and processed sugars, such as fried and sweet foods.  To prevent constipation: ? Drink enough fluid to keep your urine clear or pale yellow. ? Eat foods that are high in fiber, such as fresh fruits and vegetables, whole grains, and beans. Activity  Exercise only as directed by your health care provider. Most women can continue their usual exercise routine during pregnancy. Try to exercise for 30 minutes at least 5 days a week. Stop exercising if you experience uterine contractions.  Avoid heavy lifting, wear low heel shoes, and practice good posture.  A sexual relationship may be continued unless your health care provider directs you otherwise. Relieving pain and discomfort  Wear a good support bra to prevent discomfort from breast tenderness.  Take warm sitz baths to soothe any pain or discomfort caused by hemorrhoids. Use hemorrhoid cream if your health care provider approves.  Rest with your legs elevated if you have  leg cramps or low back pain.  If you develop varicose veins, wear support hose. Elevate your feet for 15 minutes, 3-4 times a day. Limit salt in your diet. Prenatal Care  Write down your questions. Take them to your prenatal visits.  Keep all your prenatal visits as told by your health care provider. This is important. Safety  Wear your seat belt at all times when driving.  Make a list of emergency phone numbers, including numbers for family, friends, the hospital, and police and fire departments. General instructions  Ask your health care provider for a referral to a local prenatal education class. Begin classes no later than the beginning of month 6 of your pregnancy.  Ask for help if you have counseling or nutritional needs during pregnancy. Your health care provider can offer advice or refer you to specialists for help with various needs.  Do not use hot tubs, steam rooms, or saunas.  Do not douche or use tampons or scented sanitary pads.  Do not cross your legs for long periods of time.  Avoid cat litter boxes and soil  used by cats. These carry germs that can cause birth defects in the baby and possibly loss of the fetus by miscarriage or stillbirth.  Avoid all smoking, herbs, alcohol, and unprescribed drugs. Chemicals in these products can affect the formation and growth of the baby.  Do not use any products that contain nicotine or tobacco, such as cigarettes and e-cigarettes. If you need help quitting, ask your health care provider.  Visit your dentist if you have not gone yet during your pregnancy. Use a soft toothbrush to brush your teeth and be gentle when you floss. Contact a health care provider if:  You have dizziness.  You have mild pelvic cramps, pelvic pressure, or nagging pain in the abdominal area.  You have persistent nausea, vomiting, or diarrhea.  You have a bad smelling vaginal discharge.  You have pain when you urinate. Get help right away if:  You  have a fever.  You are leaking fluid from your vagina.  You have spotting or bleeding from your vagina.  You have severe abdominal cramping or pain.  You have rapid weight gain or weight loss.  You have shortness of breath with chest pain.  You notice sudden or extreme swelling of your face, hands, ankles, feet, or legs.  You have not felt your baby move in over an hour.  You have severe headaches that do not go away when you take medicine.  You have vision changes. Summary  The second trimester is from week 14 through week 27 (months 4 through 6). It is also a time when the fetus is growing rapidly.  Your body goes through many changes during pregnancy. The changes vary from woman to woman.  Avoid all smoking, herbs, alcohol, and unprescribed drugs. These chemicals affect the formation and growth your baby.  Do not use any tobacco products, such as cigarettes, chewing tobacco, and e-cigarettes. If you need help quitting, ask your health care provider.  Contact your health care provider if you have any questions. Keep all prenatal visits as told by your health care provider. This is important. This information is not intended to replace advice given to you by your health care provider. Make sure you discuss any questions you have with your health care provider.      CHILDBIRTH CLASSES [phone] is the phone number for Pregnancy Classes or hospital tours at [facility] will be referred to  [email or web address] for more information on childbirth classes  At this site you may register for classes. You may sign up for a waiting list if classes are full. Please SIGN UP FOR THIS!.   When the waiting list becomes long, sometimes new classes can be added.

## 2018-08-18 ENCOUNTER — Other Ambulatory Visit: Payer: 59

## 2018-08-18 ENCOUNTER — Encounter (INDEPENDENT_AMBULATORY_CARE_PROVIDER_SITE_OTHER): Payer: Self-pay

## 2018-08-18 ENCOUNTER — Ambulatory Visit (INDEPENDENT_AMBULATORY_CARE_PROVIDER_SITE_OTHER): Payer: 59

## 2018-08-18 ENCOUNTER — Encounter: Payer: Self-pay | Admitting: Obstetrics and Gynecology

## 2018-08-18 ENCOUNTER — Encounter: Payer: 59 | Admitting: Obstetrics and Gynecology

## 2018-08-18 ENCOUNTER — Ambulatory Visit (INDEPENDENT_AMBULATORY_CARE_PROVIDER_SITE_OTHER): Payer: 59 | Admitting: Obstetrics and Gynecology

## 2018-08-18 VITALS — BP 133/75 | HR 98 | Wt 230.8 lb

## 2018-08-18 DIAGNOSIS — Z331 Pregnant state, incidental: Secondary | ICD-10-CM

## 2018-08-18 DIAGNOSIS — Z1389 Encounter for screening for other disorder: Secondary | ICD-10-CM

## 2018-08-18 DIAGNOSIS — Z3402 Encounter for supervision of normal first pregnancy, second trimester: Secondary | ICD-10-CM

## 2018-08-18 DIAGNOSIS — Z3A18 18 weeks gestation of pregnancy: Secondary | ICD-10-CM

## 2018-08-18 DIAGNOSIS — Z363 Encounter for antenatal screening for malformations: Secondary | ICD-10-CM

## 2018-08-18 LAB — POCT URINALYSIS DIPSTICK OB
Glucose, UA: NEGATIVE
KETONES UA: NEGATIVE
Leukocytes, UA: NEGATIVE
NITRITE UA: NEGATIVE
PROTEIN: NEGATIVE
RBC UA: NEGATIVE

## 2018-08-18 NOTE — Progress Notes (Signed)
Patient ID: Breon Diss, female   DOB: 23-Mar-1996, 22 y.o.   MRN: [id number]   LOW-RISK PREGNANCY VISIT Patient name: Meghan Burton MRN [id number]  Date of birth: 07-22-1996 Chief Complaint:   Routine Prenatal Visit (ultrasound)  History of Present Illness:   Meghan Burton is a 22 y.o. G1P0 female at [redacted]w[redacted]d with an Estimated Date of Delivery: 01/18/19 being seen today for ongoing management of a low-risk pregnancy.  Today she reports no complaints. She is nauseous occasionally and has gained weight since becoming pregnant, but it is manageable. Her and her partner are excited and she was accompanied by her sister to her appt. She is planning on attending childbirth classes.   Contractions: Not present.  .  Movement: Absent. denies leaking of fluid. Review of Systems:   Pertinent items are noted in HPI Denies abnormal vaginal discharge w/ itching/odor/irritation, headaches, visual changes, shortness of breath, chest pain, abdominal pain, severe nausea/vomiting, or problems with urination or bowel movements unless otherwise stated above. Pertinent History Reviewed:  Reviewed past medical,surgical, social, obstetrical and family history.  Reviewed problem list, medications and allergies. Physical Assessment:   Vitals:   08/18/18 1442  BP: 133/75  Pulse: 98  Weight: 230 lb 12.8 oz (104.7 kg)  Body mass index is 37.25 kg/m.        Physical Examination:   General appearance: Well appearing, and in no distress  Mental status: Alert, oriented to person, place, and time  Skin: Warm & dry  Cardiovascular: Normal heart rate noted  Respiratory: Normal respiratory effort, no distress  Abdomen: Soft, gravid, nontender  Pelvic: Cervical exam deferred         Extremities: Edema: None  Fetal Status: Fetal Heart Rate (bpm): 152 u/s   Movement: Absent    Results for orders placed or performed in visit on 08/18/18 (from the past 24 hour(s))  POC Urinalysis Dipstick OB   Collection Time: 08/18/18   2:48 PM  Result Value Ref Range   Color, UA     Clarity, UA     Glucose, UA Negative Negative   Bilirubin, UA     Ketones, UA neg    Spec Grav, UA     Blood, UA neg    pH, UA     POC Protein UA Negative Negative, Trace   Urobilinogen, UA     Nitrite, UA neg    Leukocytes, UA Negative Negative   Appearance     Odor      Assessment & Plan:  1) Low-risk pregnancy G1P0 at [redacted]w[redacted]d with an Estimated Date of Delivery: 01/18/19    Plan:  Continue routine obstetrical care  Meds: No orders of the defined types were placed in this encounter.  Labs/procedures today: [place] 18+1 wks,cephalic,fundal left placenta gr 0,normal ovaries bilat,cx 4.4 cm,svp of fluid 6.5 cm,mild right renal pelvic dilation,RK 4.7 mm,LK 3.4 mm (wnl),fhr 152 bpm,efw 268 g 89%,anatomy complete  Follow-up: Return in about 4 weeks (around 09/15/2018) for LROB.  Orders Placed This Encounter  Procedures  . POC Urinalysis Dipstick OB   By signing my name below, I, Pietro Cassis, attest that this documentation has been prepared under the direction and in the presence of Tilda Burrow, MD. Electronically Signed: Pietro Cassis, Medical Scribe. 08/18/18. 2:58 PM.  I personally performed the services described in this documentation, which was SCRIBED in my presence. The recorded information has been reviewed and considered accurate. It has been edited as necessary during review. Tilda Burrow, MD

## 2018-08-18 NOTE — Progress Notes (Signed)
[place] 18+1 wks,cephalic,fundal left placenta gr 0,normal ovaries bilat,cx 4.4 cm,svp of fluid 6.5 cm,mild right renal pelvic dilation,RK 4.7 mm,LK 3.4 mm (wnl),fhr 152 bpm,efw 268 g 89%,anatomy complete

## 2018-08-18 NOTE — Patient Instructions (Signed)
CHILDBIRTH CLASSES ° °[facility] °Call to Register: [phone] or [phone] or Register Online: [email or web address] ° °THESE CLASSES FILL UP VERY QUICKLY, SO SIGN UP AS SOON AS YOU CAN!!! ° °*Please visit [facility] pregnancy website at [email or web address]* ° °Option 1: Birth & Baby Series °• This series of 3 weekly classes helps you and your labor partner prepare for childbirth at [facility]. °• Reviews newborn care, labor & birth, pain management, and comfort techniques °• Maternity Care Center Tour of [facility] is included. °• Cost: $60 per couple for insured or self-pay, $30 per couple for Medicaid ° °Option 2: Weekend Birth & Baby °• This class is a weekend version of our Birth & Baby series. It is designed for parents who have a difficult time fitting several weeks of classes into their schedule.  °• Maternity Care Center Tour of [facility] is included.  °• Friday 6:30pm-8:30pm, Saturday 9am-4pm °• Cost: $75 per couple for insured or self-pay, $30 per couple for Medicaid ° °Option 3: Natural Childbirth °• This series of 5 weekly classes is for expectant parents who want to learn and practice natural methods of coping with the process of labor and childbirth. °• Maternity Care Center Tour of [facility] is included. °• Cost: $75 per couple for insured or self-pay, $30 per couple for Medicaid ° °Option 4: Online Birth & Baby °• This online class offers you the freedom to complete a Birth & Baby series in the comfort of your own home. The flexibility of this option allows you to review sections at your own place, at times convenient to you and your support people.  °• Cost: $60 for 60 days of online access ° ° ° °Other Available Classes °Baby & Me °Enjoy this time to discuss newborn & infant parenting topics and family adjustment issues with other new mothers in a relaxed environment. Each week brings a new speaker or baby-centered activity. We encourage  mothers and their babies (birth to crawling) to join us every Thursday in the [facility] at 11:00 am. You are welcome to visit this group even if you haven't delivered yet! It's wonderful to make new friends early and watch other moms interact with their babies. No registration or fee.  ° °Big Brother/ Big Sister °Let your children share in the joy of a new brother or sister in this special class designed just for them. This class is designed for children ages 2-6, but any age is welcome. Please register each child individually. ° °Breastfeeding Support Group °This group is a mother-to-mother support circle where moms have the opportunity to share their breastfeeding experiences. A breastfeeding Support nurse is present for questions and concerns. Meets each Tuesday at 11:00 am. No fee or registration. ° °Breastfeeding Your Baby °Breastfeeding is a special time for mother and child. This class will help you feel ready to begin this important relationship. Your partner is encouraged to attend with you.  ° °Caring For Baby °This class is for expectant  and adoptive parents who want to learn and practice the most up-to-date newborn care for their babies. Register only the mom-to-be and your partner can come with you. (Note: This class is included in the Birth & Baby series and the Weekend Birth & Baby classes.) ° °Comfort Techniques & Tour °This 2-hour interactive class will provide you the opportunity to learn & practice hands-on techniques with your partner that can help relieve some discomfort of labor and encourage your baby to rotate toward   the best position for birth. A tour of the [facility] is included.  ° °Daddy Boot Camp °This course offers Dads-to-be the tools and knowledge needed to feel confident on their journey to becoming new fathers. ° °Grandparent Love °Expecting a grandbaby? Learn about the latest infant care and safety recommendation and ways to  support your own child as he or she transitions into the parenting role.  ° °Infant and Child CPR °Parents, grandparents, babysitters, and friends learn Cardio-Pulmonary Resuscitation skills for infants and children. Register each participant individually. (Note: This Family & Friends program does not offer certification.) ° °Marvelous Multiples °Expecting twins, triplets, or more? This class covers the differences in labor, birth, parenting, and breastfeeding issues that face multiples' parents. NICU tour is included.  ° °Mom Talk °This mom-led group offers support and connection to mothers as they journey through the adjustments and struggles of that sometimes overwhelming first year after the birth of a child. A member of our [facility] staff will be present to share resources and additional support if needed, as you care for yourself and baby. You are welcome to visit the group before you deliver! It's wonderful to meet new friends early and watch other moms interact with their babies. It's held at [facility] at 10:00 am each Tuesday morning and 6:00 pm each Thursday evening. Babies (birth to crawling) welcome. No registration or fee.  ° °Waterbirth Classes °Interested in a waterbirth? This informational class will help you discover whether waterbirth is the right fir for you.  ° °[facility] Virtual Maternity Tour °View a virtual tour of [facility]. In-person tours are available for participants of childbirth education classes.  ° °

## 2018-09-15 ENCOUNTER — Other Ambulatory Visit: Payer: Self-pay

## 2018-09-15 ENCOUNTER — Encounter: Payer: Self-pay | Admitting: Advanced Practice Midwife

## 2018-09-15 ENCOUNTER — Encounter (INDEPENDENT_AMBULATORY_CARE_PROVIDER_SITE_OTHER): Payer: Self-pay

## 2018-09-15 ENCOUNTER — Ambulatory Visit (INDEPENDENT_AMBULATORY_CARE_PROVIDER_SITE_OTHER): Payer: 59 | Admitting: Advanced Practice Midwife

## 2018-09-15 VITALS — BP 133/77 | HR 91 | Wt 240.0 lb

## 2018-09-15 DIAGNOSIS — Z3A22 22 weeks gestation of pregnancy: Secondary | ICD-10-CM

## 2018-09-15 DIAGNOSIS — Z3402 Encounter for supervision of normal first pregnancy, second trimester: Secondary | ICD-10-CM

## 2018-09-15 DIAGNOSIS — Z1389 Encounter for screening for other disorder: Secondary | ICD-10-CM

## 2018-09-15 DIAGNOSIS — Z362 Encounter for other antenatal screening follow-up: Secondary | ICD-10-CM

## 2018-09-15 DIAGNOSIS — Z331 Pregnant state, incidental: Secondary | ICD-10-CM

## 2018-09-15 LAB — POCT URINALYSIS DIPSTICK OB
Blood, UA: NEGATIVE
Glucose, UA: NEGATIVE
KETONES UA: NEGATIVE
Leukocytes, UA: NEGATIVE
Nitrite, UA: NEGATIVE
POC,PROTEIN,UA: NEGATIVE

## 2018-09-15 NOTE — Progress Notes (Signed)
  G1P0 [redacted]w[redacted]d Estimated Date of Delivery: 01/18/19  Blood pressure 133/77, pulse 91, weight 240 lb (108.9 kg), last menstrual period 04/13/2018.   BP weight and urine results all reviewed and noted.  Please refer to the obstetrical flow sheet for the fundal height and fetal heart rate documentation:  Patient reports good fetal movement, denies any bleeding and no rupture of membranes symptoms or regular contractions. Patient is without complaints. All questions were answered.   Physical Assessment:   Vitals:   09/15/18 1058  BP: 133/77  Pulse: 91  Weight: 240 lb (108.9 kg)  Body mass index is 38.74 kg/m.        Physical Examination:   General appearance: Well appearing, and in no distress  Mental status: Alert, oriented to person, place, and time  Skin: Warm & dry  Cardiovascular: Normal heart rate noted  Respiratory: Normal respiratory effort, no distress  Abdomen: Soft, gravid, nontender  Pelvic: Cervical exam deferred         Extremities: Edema: None  Fetal Status: Fetal Heart Rate (bpm): 148   Movement: Present    Results for orders placed or performed in visit on 09/15/18 (from the past 24 hour(s))  POC Urinalysis Dipstick OB   Collection Time: 09/15/18 10:58 AM  Result Value Ref Range   Color, UA     Clarity, UA     Glucose, UA Negative Negative   Bilirubin, UA     Ketones, UA neg    Spec Grav, UA     Blood, UA neg    pH, UA     POC,PROTEIN,UA Negative Negative, Trace   Urobilinogen, UA     Nitrite, UA neg    Leukocytes, UA Negative Negative   Appearance f    Odor       Orders Placed This Encounter  Procedures  . US OB Follow Up  . POC Urinalysis Dipstick OB    Plan:  Continued routine obstetrical care,   Return in about 4 weeks (around 10/13/2018) for US:OB F/U:, PN2/LROB.

## 2018-09-15 NOTE — Patient Instructions (Signed)

## 2018-10-11 ENCOUNTER — Ambulatory Visit (INDEPENDENT_AMBULATORY_CARE_PROVIDER_SITE_OTHER): Payer: 59 | Admitting: Obstetrics & Gynecology

## 2018-10-11 ENCOUNTER — Telehealth: Payer: Self-pay | Admitting: Obstetrics & Gynecology

## 2018-10-11 ENCOUNTER — Telehealth: Payer: Self-pay | Admitting: *Deleted

## 2018-10-11 VITALS — BP 136/84 | HR 90 | Temp 99.5°F | Wt 249.0 lb

## 2018-10-11 DIAGNOSIS — J40 Bronchitis, not specified as acute or chronic: Secondary | ICD-10-CM

## 2018-10-11 MED ORDER — PREDNISONE 10 MG PO TABS: ORAL_TABLET | ORAL | 0 refills | Status: DC

## 2018-10-11 MED ORDER — AZITHROMYCIN 250 MG PO TABS: ORAL_TABLET | ORAL | 0 refills | Status: DC

## 2018-10-11 NOTE — Telephone Encounter (Signed)
Informed patient she is fine to work.  Verbalized understanding.

## 2018-10-11 NOTE — Telephone Encounter (Signed)
Patient called stating that she had an appointment today and would like to know if she needs to stay out of work. If so pt needs a note to take to work. Please contact pt

## 2018-10-11 NOTE — Telephone Encounter (Signed)
Patient called stating she thinks she has bronchitis.  She is having difficulty breathing at times and a productive cough.  Will w/i for eval.

## 2018-10-11 NOTE — Progress Notes (Signed)
Chief Complaint  Patient presents with  . URI      22 y.o. G1P0 Patient's last menstrual period was 04/13/2018. The current method of family planning is pregnant.  Outpatient Encounter Medications as of 10/11/2018  Medication Sig  . prenatal vitamin w/FE, FA (PRENATAL 1 + 1) 27-1 MG TABS tablet Take 1 tablet by mouth daily at 12 noon.  Marland Kitchen azithromycin (ZITHROMAX Z-PAK) 250 MG tablet Take 2 tablets today and then 1 a day til finished  . Doxylamine-Pyridoxine (DICLEGIS) 10-10 MG TBEC 2 tabs q hs, if sx persist add 1 tab q am on day 3, if sx persist add 1 tab q afternoon on day 4 (Patient not taking: Reported on 07/05/2018)  . predniSONE (DELTASONE) 10 MG tablet Take 4 tablets all at once daily for 10 days  . promethazine (PHENERGAN) 25 MG tablet TAKE 1 TABLET (25 MG TOTAL) BY MOUTH EVERY 6 (SIX) HOURS AS NEEDED FOR NAUSEA OR VOMITING. (Patient not taking: Reported on 09/15/2018)   No facility-administered encounter medications on file as of 10/11/2018.     Subjective Pt having cough fever for 2 days Productive yellow thick No OB issues Past Medical History:  Diagnosis Date  . Allergy    seasonal  . Closed fracture of lateral portion of left tibial plateau 11/13/2014  . Tibial plateau fracture, left   . Vision abnormalities    glasses    Past Surgical History:  Procedure Laterality Date  . MYRINGOTOMY WITH TUBE PLACEMENT    . ORIF TIBIA PLATEAU Left 11/13/2014   Procedure: OPEN REDUCTION INTERNAL FIXATION (ORIF) LEFT LATERAL TIBIAL PLATEAU;  Surgeon: Eulas Post, MD;  Location: [facility];  Service: Orthopedics;  Laterality: Left;    OB History    Gravida  1   Para      Term      Preterm      AB      Living        SAB      TAB      Ectopic      Multiple      Live Births              No Known Allergies  Social History   Socioeconomic History  . Marital status: Single    Spouse name: Niel Hummer  . Number of children:  0  . Years of education: 61  . Highest education level: 12th grade  Occupational History  . Not on file  Social Needs  . Financial resource strain: Somewhat hard  . Food insecurity:    Worry: Sometimes true    Inability: Sometimes true  . Transportation needs:    Medical: No    Non-medical: No  Tobacco Use  . Smoking status: Never Smoker  . Smokeless tobacco: Never Used  Substance and Sexual Activity  . Alcohol use: No  . Drug use: No  . Sexual activity: Yes    Birth control/protection: None  Lifestyle  . Physical activity:    Days per week: 0 days    Minutes per session: 0 min  . Stress: Not at all  Relationships  . Social connections:    Talks on phone: More than three times a week    Gets together: Once a week    Attends religious service: 1 to 4 times per year    Active member of club or organization: No    Attends meetings of clubs or organizations: Never  Relationship status: Never married  Other Topics Concern  . Not on file  Social History Narrative  . Not on file    Family History  Problem Relation Age of Onset  . Thyroid disease Maternal Grandmother   . Hypertension Maternal Grandmother   . Hypertension Mother     Medications:       Current Outpatient Medications:  .  prenatal vitamin w/FE, FA (PRENATAL 1 + 1) 27-1 MG TABS tablet, Take 1 tablet by mouth daily at 12 noon., Disp: 30 each, Rfl: 12 .  azithromycin (ZITHROMAX Z-PAK) 250 MG tablet, Take 2 tablets today and then 1 a day til finished, Disp: 6 each, Rfl: 0 .  Doxylamine-Pyridoxine (DICLEGIS) 10-10 MG TBEC, 2 tabs q hs, if sx persist add 1 tab q am on day 3, if sx persist add 1 tab q afternoon on day 4 (Patient not taking: Reported on 07/05/2018), Disp: 100 tablet, Rfl: 6 .  predniSONE (DELTASONE) 10 MG tablet, Take 4 tablets all at once daily for 10 days, Disp: 40 tablet, Rfl: 0 .  promethazine (PHENERGAN) 25 MG tablet, TAKE 1 TABLET (25 MG TOTAL) BY MOUTH EVERY 6 (SIX) HOURS AS NEEDED FOR  NAUSEA OR VOMITING. (Patient not taking: Reported on 09/15/2018), Disp: 15 tablet, Rfl: 1  Objective Blood pressure 136/84, pulse 90, temperature 99.5 F (37.5 C), weight 249 lb (112.9 kg), last menstrual period 04/13/2018.  Gen WDWN NAD Lungs scattered coarse rhochi with wheezes noconsoliation  Pertinent ROS Non smoker  Labs or studies     Impression Diagnoses this Encounter::   ICD-10-CM   1. Bronchitis J40     Established relevant diagnosis(es):   Plan/Recommendations: Meds ordered this encounter  Medications  . azithromycin (ZITHROMAX Z-PAK) 250 MG tablet    Sig: Take 2 tablets today and then 1 a day til finished    Dispense:  6 each    Refill:  0  . predniSONE (DELTASONE) 10 MG tablet    Sig: Take 4 tablets all at once daily for 10 days    Dispense:  40 tablet    Refill:  0    Labs or Scans Ordered: No orders of the defined types were placed in this encounter.   Management:: >prednisone + z pak for bronchitis  Follow up Return in about 1 week (around 10/18/2018) for PN2, , LROB.       All questions were answered.

## 2018-10-13 ENCOUNTER — Other Ambulatory Visit: Payer: 59

## 2018-10-13 ENCOUNTER — Encounter: Payer: 59 | Admitting: Advanced Practice Midwife

## 2018-10-21 ENCOUNTER — Other Ambulatory Visit: Payer: 59

## 2018-10-21 ENCOUNTER — Encounter: Payer: Self-pay | Admitting: Obstetrics & Gynecology

## 2018-10-21 ENCOUNTER — Ambulatory Visit (INDEPENDENT_AMBULATORY_CARE_PROVIDER_SITE_OTHER): Payer: 59

## 2018-10-21 ENCOUNTER — Ambulatory Visit (INDEPENDENT_AMBULATORY_CARE_PROVIDER_SITE_OTHER): Payer: 59 | Admitting: Obstetrics & Gynecology

## 2018-10-21 VITALS — BP 129/73 | HR 87 | Wt 253.5 lb

## 2018-10-21 DIAGNOSIS — Z3A27 27 weeks gestation of pregnancy: Secondary | ICD-10-CM

## 2018-10-21 DIAGNOSIS — Z3402 Encounter for supervision of normal first pregnancy, second trimester: Secondary | ICD-10-CM | POA: Diagnosis not present

## 2018-10-21 DIAGNOSIS — Z362 Encounter for other antenatal screening follow-up: Secondary | ICD-10-CM | POA: Diagnosis not present

## 2018-10-21 DIAGNOSIS — Z1389 Encounter for screening for other disorder: Secondary | ICD-10-CM

## 2018-10-21 DIAGNOSIS — Z331 Pregnant state, incidental: Secondary | ICD-10-CM

## 2018-10-21 LAB — POCT URINALYSIS DIPSTICK OB
Blood, UA: NEGATIVE
GLUCOSE, UA: NEGATIVE
Ketones, UA: NEGATIVE
LEUKOCYTES UA: NEGATIVE
Nitrite, UA: NEGATIVE
POC,PROTEIN,UA: NEGATIVE

## 2018-10-21 NOTE — Progress Notes (Signed)
US 27+2 wks,cephalic,fundal placenta gr 2,afi 14 cm,normal ovaries bilat,left renal pelvis 3 mm(wnl),mild dilation right renal pelvis 6.4 mm,fhr 124 bpm,EFW 1215 g 79%

## 2018-10-21 NOTE — Progress Notes (Signed)
   LOW-RISK PREGNANCY VISIT Patient name: Meghan Burton MRN [id number]  Date of birth: [birth year] Chief Complaint:   Routine Prenatal Visit (PN2 and US)  History of Present Illness:   Meghan Burton is a 22 y.o. G1P0 female at 3917w2d with an Estimated Date of Delivery: 01/18/19 being seen today for ongoing management of a low-risk pregnancy.  Today she reports no complaints. Contractions: Not present. Vag. Bleeding: None.  Movement: Present. denies leaking of fluid. Review of Systems:   Pertinent items are noted in HPI Denies abnormal vaginal discharge w/ itching/odor/irritation, headaches, visual changes, shortness of breath, chest pain, abdominal pain, severe nausea/vomiting, or problems with urination or bowel movements unless otherwise stated above. Pertinent History Reviewed:  Reviewed past medical,surgical, social, obstetrical and family history.  Reviewed problem list, medications and allergies. Physical Assessment:   Vitals:   10/21/18 0935  BP: 129/73  Pulse: 87  Weight: 253 lb 8 oz (115 kg)  Body mass index is 40.92 kg/m.        Physical Examination:   General appearance: Well appearing, and in no distress  Mental status: Alert, oriented to person, place, and time  Skin: Warm & dry  Cardiovascular: Normal heart rate noted  Respiratory: Normal respiratory effort, no distress  Abdomen: Soft, gravid, nontender  Pelvic: Cervical exam deferred         Extremities: Edema: Trace  Fetal Status:     Movement: Present    Results for orders placed or performed in visit on 10/21/18 (from the past 24 hour(s))  POC Urinalysis Dipstick OB   Collection Time: 10/21/18  9:36 AM  Result Value Ref Range   Color, UA     Clarity, UA     Glucose, UA Negative Negative   Bilirubin, UA     Ketones, UA neg    Spec Grav, UA     Blood, UA neg    pH, UA     POC,PROTEIN,UA Negative Negative, Trace, Small (1+), Moderate (2+), Large (3+), 4+   Urobilinogen, UA     Nitrite, UA neg    Leukocytes, UA Negative Negative   Appearance     Odor      Assessment & Plan:  1) Low-risk pregnancy G1P0 at 817w2d with an Estimated Date of Delivery: 01/18/19   2) RPD, stable,<477mm, insignificant, no indication for repeat   Meds: No orders of the defined types were placed in this encounter.  Labs/procedures today: sonogram PN2  Plan:  Continue routine obstetrical care   Reviewed: Term labor symptoms and general obstetric precautions including but not limited to vaginal bleeding, contractions, leaking of fluid and fetal movement were reviewed in detail with the patient.  All questions were answered  Follow-up: Return in about 3 weeks (around 11/11/2018) for LROB.  Orders Placed This Encounter  Procedures  . POC Urinalysis Dipstick OB   Lazaro ArmsLuther H Mariavictoria Nottingham  10/21/2018 9:51 AM

## 2018-10-22 LAB — GLUCOSE TOLERANCE, 2 HOURS W/ 1HR
GLUCOSE, FASTING: 70 mg/dL (ref 65–91)
Glucose, 1 hour: 96 mg/dL (ref 65–179)
Glucose, 2 hour: 71 mg/dL (ref 65–152)

## 2018-10-22 LAB — CBC
HEMOGLOBIN: 10.6 g/dL — AB (ref 11.1–15.9)
Hematocrit: 33 % — ABNORMAL LOW (ref 34.0–46.6)
MCH: 25.2 pg — AB (ref 26.6–33.0)
MCHC: 32.1 g/dL (ref 31.5–35.7)
MCV: 79 fL (ref 79–97)
PLATELETS: 245 10*3/uL (ref 150–450)
RBC: 4.2 x10E6/uL (ref 3.77–5.28)
RDW: 13.5 % (ref 12.3–15.4)
WBC: 11.9 10*3/uL — ABNORMAL HIGH (ref 3.4–10.8)

## 2018-10-22 LAB — RPR: RPR: NONREACTIVE

## 2018-10-22 LAB — HIV ANTIBODY (ROUTINE TESTING W REFLEX): HIV Screen 4th Generation wRfx: NONREACTIVE

## 2018-10-22 LAB — ANTIBODY SCREEN: Antibody Screen: NEGATIVE

## 2018-11-09 NOTE — L&D Delivery Note (Signed)
Patient: Meghan Burton MRN: [id number]  GBS status: positive, IAP given: [facility]  Patient is a 23 y.o. now G1P1 s/p NSVD at [redacted]w[redacted]d, who was admitted for IOL for gHTN. SROM 14h 78m prior to delivery with clear fluid.    Delivery Note At 6:04 PM a viable female was delivered via Vaginal, Spontaneous (Presentation: cephalic; LOA).  APGAR: 9, 9; weight 6 lb 7.2 oz (2926 g).   Placenta status: intact (trailing membranes, 3-vessel cord).  Cord:  with the following complications: nuchal.  Cord pH: not sent  Head delivered LOA. No nuchal cord present. Shoulder and body delivered in usual fashion. Infant with spontaneous cry, placed on mother's abdomen, dried and bulb suctioned. Cord clamped x 2 after 1-minute delay, and cut by family member. Cord blood drawn. Placenta delivered spontaneously with gentle cord traction. Fundus firm with massage and Pitocin. Perineum inspected and found to have 2nd degree perineal and R vaginal side wall laceration, which were repaired with 3.0 vicryl with good hemostasis achieved.  Anesthesia: epidural  Episiotomy: None Lacerations: 2nd degree Suture Repair: 3.0 vicryl Est. Blood Loss (mL): 400  Mom to postpartum.  Baby to Couplet care / Skin to Skin.  Gwenevere Abbot 12/28/2018, 9:01 PM

## 2018-11-11 ENCOUNTER — Ambulatory Visit (INDEPENDENT_AMBULATORY_CARE_PROVIDER_SITE_OTHER): Payer: 59 | Admitting: Obstetrics and Gynecology

## 2018-11-11 ENCOUNTER — Encounter: Payer: Self-pay | Admitting: Obstetrics and Gynecology

## 2018-11-11 VITALS — BP 137/79 | HR 106 | Wt 256.6 lb

## 2018-11-11 DIAGNOSIS — Z3403 Encounter for supervision of normal first pregnancy, third trimester: Secondary | ICD-10-CM

## 2018-11-11 DIAGNOSIS — Z331 Pregnant state, incidental: Secondary | ICD-10-CM

## 2018-11-11 DIAGNOSIS — Z23 Encounter for immunization: Secondary | ICD-10-CM | POA: Diagnosis not present

## 2018-11-11 DIAGNOSIS — Z1389 Encounter for screening for other disorder: Secondary | ICD-10-CM

## 2018-11-11 DIAGNOSIS — Z3A30 30 weeks gestation of pregnancy: Secondary | ICD-10-CM

## 2018-11-11 LAB — POCT URINALYSIS DIPSTICK OB
Blood, UA: NEGATIVE
Glucose, UA: NEGATIVE
Ketones, UA: NEGATIVE
Nitrite, UA: NEGATIVE
PROTEIN: NEGATIVE

## 2018-11-11 NOTE — Progress Notes (Signed)
Patient ID: Desarae Harstad, female   DOB: December 15, 1995, 23 y.o.   MRN: [id number]    LOW-RISK PREGNANCY VISIT Patient name: Meghan Burton MRN [id number]  Date of birth: 1996-06-13 Chief Complaint:   Routine Prenatal Visit  History of Present Illness:   Meghan Burton is a 23 y.o. G1P0 female at [redacted]w[redacted]d with an Estimated Date of Delivery: 01/18/19 being seen today for ongoing management of a low-risk pregnancy. Is also first baby for father and hopes her mom will be with her in labor. She plans on breast feeding and hasn't signed up for any child birthing classes. Today she reports no complaints. Contractions: Not present. Vag. Bleeding: None.  Movement: Present. denies leaking of fluid. Review of Systems:   Pertinent items are noted in HPI Denies abnormal vaginal discharge w/ itching/odor/irritation, headaches, visual changes, shortness of breath, chest pain, abdominal pain, severe nausea/vomiting, or problems with urination or bowel movements unless otherwise stated above. Pertinent History Reviewed:  Reviewed past medical,surgical, social, obstetrical and family history.  Reviewed problem list, medications and allergies. Physical Assessment:   Vitals:   11/11/18 1105  BP: 137/79  Pulse: (!) 106  Weight: 256 lb 9.6 oz (116.4 kg)  Body mass index is 41.42 kg/m.        Physical Examination:   General appearance: Well appearing, and in no distress  Mental status: Alert, oriented to person, place, and time  Skin: Warm & dry  Cardiovascular: Normal heart rate noted  Respiratory: Normal respiratory effort, no distress  Abdomen: Soft, gravid, nontender  Pelvic: Cervical exam deferred         Extremities: Edema: None  Fetal Status: Fetal Heart Rate (bpm): 134 Fundal Height: 30 cm Movement: Present    Results for orders placed or performed in visit on 11/11/18 (from the past 24 hour(s))  POC Urinalysis Dipstick OB   Collection Time: 11/11/18 11:06 AM  Result Value Ref Range   Color, UA     Clarity, UA     Glucose, UA Negative Negative   Bilirubin, UA     Ketones, UA neg    Spec Grav, UA     Blood, UA neg    pH, UA     POC,PROTEIN,UA Negative Negative, Trace, Small (1+), Moderate (2+), Large (3+), 4+   Urobilinogen, UA     Nitrite, UA neg    Leukocytes, UA Trace (A) Negative   Appearance     Odor      Assessment & Plan:  1) Low-risk pregnancy G1P0 at [redacted]w[redacted]d with an Estimated Date of Delivery: 01/18/19     Meds: No orders of the defined types were placed in this encounter.  Labs/procedures today: None  Plan:  Continue routine obstetrical care  F/u in 2 weeks   Follow-up: Return in about 2 weeks (around 11/25/2018).  Orders Placed This Encounter  Procedures  . Tdap vaccine greater than or equal to 7yo IM  . POC Urinalysis Dipstick OB   By signing my name below, I, Arnette Norris, attest that this documentation has been prepared under the direction and in the presence of Tilda Burrow, MD. Electronically Signed: Arnette Norris Medical Scribe. 11/11/18. 11:22 AM.  I personally performed the services described in this documentation, which was SCRIBED in my presence. The recorded information has been reviewed and considered accurate. It has been edited as necessary during review. Tilda Burrow, MD

## 2018-11-25 ENCOUNTER — Ambulatory Visit (INDEPENDENT_AMBULATORY_CARE_PROVIDER_SITE_OTHER): Payer: 59 | Admitting: Advanced Practice Midwife

## 2018-11-25 ENCOUNTER — Other Ambulatory Visit: Payer: Self-pay

## 2018-11-25 ENCOUNTER — Encounter: Payer: Self-pay | Admitting: Advanced Practice Midwife

## 2018-11-25 VITALS — BP 136/82 | HR 94 | Wt 260.0 lb

## 2018-11-25 DIAGNOSIS — Z3403 Encounter for supervision of normal first pregnancy, third trimester: Secondary | ICD-10-CM

## 2018-11-25 DIAGNOSIS — Z1389 Encounter for screening for other disorder: Secondary | ICD-10-CM

## 2018-11-25 DIAGNOSIS — Z3A32 32 weeks gestation of pregnancy: Secondary | ICD-10-CM

## 2018-11-25 DIAGNOSIS — Z331 Pregnant state, incidental: Secondary | ICD-10-CM

## 2018-11-25 LAB — POCT URINALYSIS DIPSTICK OB
Blood, UA: NEGATIVE
Glucose, UA: NEGATIVE
Ketones, UA: NEGATIVE
Leukocytes, UA: NEGATIVE
Nitrite, UA: NEGATIVE
POC,PROTEIN,UA: NEGATIVE

## 2018-11-25 NOTE — Progress Notes (Signed)
  G1P0 [redacted]w[redacted]d Estimated Date of Delivery: 01/18/19  Blood pressure 136/82, pulse 94, weight 260 lb (117.9 kg), last menstrual period 04/13/2018.   BP weight and urine results all reviewed and noted.  Please refer to the obstetrical flow sheet for the fundal height and fetal heart rate documentation:  Patient reports good fetal movement, denies any bleeding and no rupture of membranes symptoms or regular contractions. Patient is without complaints. All questions were answered.   Physical Assessment:   Vitals:   11/25/18 1205 11/25/18 1224  BP: (!) 141/75 136/82  Pulse: 94   Weight: 260 lb (117.9 kg)   Body mass index is 41.97 kg/m.        Physical Examination:   General appearance: Well appearing, and in no distress  Mental status: Alert, oriented to person, place, and time  Skin: Warm & dry  Cardiovascular: Normal heart rate noted  Respiratory: Normal respiratory effort, no distress  Abdomen: Soft, gravid, nontender  Pelvic: Cervical exam deferred         Extremities: Edema: Trace  Fetal Status: Fetal Heart Rate (bpm): 140 Fundal Height: 32 cm Movement: Present    Results for orders placed or performed in visit on 11/25/18 (from the past 24 hour(s))  POC Urinalysis Dipstick OB   Collection Time: 11/25/18 12:05 PM  Result Value Ref Range   Color, UA     Clarity, UA     Glucose, UA Negative Negative   Bilirubin, UA     Ketones, UA neg    Spec Grav, UA     Blood, UA neg    pH, UA     POC,PROTEIN,UA Negative Negative, Trace, Small (1+), Moderate (2+), Large (3+), 4+   Urobilinogen, UA     Nitrite, UA neg    Leukocytes, UA Negative Negative   Appearance     Odor       Orders Placed This Encounter  Procedures  . POC Urinalysis Dipstick OB    Plan:  Continued routine obstetrical care,   Return in about 1 week (around 12/02/2018) for LROB, BP check.

## 2018-11-25 NOTE — Patient Instructions (Addendum)
Meghan Burton, I greatly value your feedback.  If you receive a survey following your visit with [place] today, we appreciate you taking the time to fill it out.  Thanks, Cathie Beams, CNM   Call the office [phone]) or go to [facility] if:  You begin to have strong, frequent contractions  Your water breaks.  Sometimes it is a big gush of fluid, sometimes it is just a trickle that keeps getting your panties wet or running down your legs  You have vaginal bleeding.  It is normal to have a small amount of spotting if your cervix was checked.   You don't feel your baby moving like normal.  If you don't, get you something to eat and drink and lay down and focus on feeling your baby move.  You should feel at least 10 movements in 2 hours.  If you don't, you should call the office or go to [facility].    Tdap Vaccine  It is recommended that you get the Tdap vaccine during the third trimester of EACH pregnancy to help protect your baby from getting pertussis (whooping cough)  27-36 weeks is the BEST time to do this so that you can pass the protection on to your baby. During pregnancy is better than after pregnancy, but if you are unable to get it during pregnancy it will be offered at the hospital.   You will be offered this vaccine in the office after 27 weeks. If you do not have health insurance, you can get this vaccine at the health department or your family doctor  Everyone who will be around your baby should also be up-to-date on their vaccines. Adults (who are not pregnant) only need 1 dose of Tdap during adulthood.   Third Trimester of Pregnancy The third trimester is from week 29 through week 42, months 7 through 9. The third trimester is a time when the fetus is growing rapidly. At the end of the ninth month, the fetus is about 20 inches in length and weighs 6-10 pounds.  BODY CHANGES Your body goes through many changes during pregnancy. The changes vary from woman to  woman.   Your weight will continue to increase. You can expect to gain 25-35 pounds (11-16 kg) by the end of the pregnancy.  You may begin to get stretch marks on your hips, abdomen, and breasts.  You may urinate more often because the fetus is moving lower into your pelvis and pressing on your bladder.  You may develop or continue to have heartburn as a result of your pregnancy.  You may develop constipation because certain hormones are causing the muscles that push waste through your intestines to slow down.  You may develop hemorrhoids or swollen, bulging veins (varicose veins).  You may have pelvic pain because of the weight gain and pregnancy hormones relaxing your joints between the bones in your pelvis. Backaches may result from overexertion of the muscles supporting your posture.  You may have changes in your hair. These can include thickening of your hair, rapid growth, and changes in texture. Some women also have hair loss during or after pregnancy, or hair that feels dry or thin. Your hair will most likely return to normal after your baby is born.  Your breasts will continue to grow and be tender. A yellow discharge may leak from your breasts called colostrum.  Your belly button may stick out.  You may feel short of breath because of your expanding uterus.  You  may notice the fetus "dropping," or moving lower in your abdomen.  You may have a bloody mucus discharge. This usually occurs a few days to a week before labor begins.  Your cervix becomes thin and soft (effaced) near your due date. WHAT TO EXPECT AT YOUR PRENATAL EXAMS  You will have prenatal exams every 2 weeks until week 36. Then, you will have weekly prenatal exams. During a routine prenatal visit:  You will be weighed to make sure you and the fetus are growing normally.  Your blood pressure is taken.  Your abdomen will be measured to track your baby's growth.  The fetal heartbeat will be listened  to.  Any test results from the previous visit will be discussed.  You may have a cervical check near your due date to see if you have effaced. At around 36 weeks, your caregiver will check your cervix. At the same time, your caregiver will also perform a test on the secretions of the vaginal tissue. This test is to determine if a type of bacteria, Group B streptococcus, is present. Your caregiver will explain this further. Your caregiver may ask you:  What your birth plan is.  How you are feeling.  If you are feeling the baby move.  If you have had any abnormal symptoms, such as leaking fluid, bleeding, severe headaches, or abdominal cramping.  If you have any questions. Other tests or screenings that may be performed during your third trimester include:  Blood tests that check for low iron levels (anemia).  Fetal testing to check the health, activity level, and growth of the fetus. Testing is done if you have certain medical conditions or if there are problems during the pregnancy. FALSE LABOR You may feel small, irregular contractions that eventually go away. These are called Braxton Hicks contractions, or false labor. Contractions may last for hours, days, or even weeks before true labor sets in. If contractions come at regular intervals, intensify, or become painful, it is best to be seen by your caregiver.  SIGNS OF LABOR   Menstrual-like cramps.  Contractions that are 5 minutes apart or less.  Contractions that start on the top of the uterus and spread down to the lower abdomen and back.  A sense of increased pelvic pressure or back pain.  A watery or bloody mucus discharge that comes from the vagina. If you have any of these signs before the 37th week of pregnancy, call your caregiver right away. You need to go to the hospital to get checked immediately. HOME CARE INSTRUCTIONS   Avoid all smoking, herbs, alcohol, and unprescribed drugs. These chemicals affect the  formation and growth of the baby.  Follow your caregiver's instructions regarding medicine use. There are medicines that are either safe or unsafe to take during pregnancy.  Exercise only as directed by your caregiver. Experiencing uterine cramps is a good sign to stop exercising.  Continue to eat regular, healthy meals.  Wear a good support bra for breast tenderness.  Do not use hot tubs, steam rooms, or saunas.  Wear your seat belt at all times when driving.  Avoid raw meat, uncooked cheese, cat litter boxes, and soil used by cats. These carry germs that can cause birth defects in the baby.  Take your prenatal vitamins.  Try taking a stool softener (if your caregiver approves) if you develop constipation. Eat more high-fiber foods, such as fresh vegetables or fruit and whole grains. Drink plenty of fluids to keep your urine clear  or pale yellow.  Take warm sitz baths to soothe any pain or discomfort caused by hemorrhoids. Use hemorrhoid cream if your caregiver approves.  If you develop varicose veins, wear support hose. Elevate your feet for 15 minutes, 3-4 times a day. Limit salt in your diet.  Avoid heavy lifting, wear low heal shoes, and practice good posture.  Rest a lot with your legs elevated if you have leg cramps or low back pain.  Visit your dentist if you have not gone during your pregnancy. Use a soft toothbrush to brush your teeth and be gentle when you floss.  A sexual relationship may be continued unless your caregiver directs you otherwise.  Do not travel far distances unless it is absolutely necessary and only with the approval of your caregiver.  Take prenatal classes to understand, practice, and ask questions about the labor and delivery.  Make a trial run to the hospital.  Pack your hospital bag.  Prepare the baby's nursery.  Continue to go to all your prenatal visits as directed by your caregiver. SEEK MEDICAL CARE IF:  You are unsure if you are in  labor or if your water has broken.  You have dizziness.  You have mild pelvic cramps, pelvic pressure, or nagging pain in your abdominal area.  You have persistent nausea, vomiting, or diarrhea.  You have a bad smelling vaginal discharge.  You have pain with urination. SEEK IMMEDIATE MEDICAL CARE IF:   You have a fever.  You are leaking fluid from your vagina.  You have spotting or bleeding from your vagina.  You have severe abdominal cramping or pain.  You have rapid weight loss or gain.  You have shortness of breath with chest pain.  You notice sudden or extreme swelling of your face, hands, ankles, feet, or legs.  You have not felt your baby move in over an hour.  You have severe headaches that do not go away with medicine.  You have vision changes. Document Released: 10/20/2001 Document Revised: 10/31/2013 Document Reviewed: 12/27/2012 [facility] Patient Information 2015 [place], [place]. This information is not intended to replace advice given to you by your health care provider. Make sure you discuss any questions you have with your health care provider.  [place] Pediatricians/Family Doctors:  [facility] [phone]            [facility] [phone]                 [facility] [phone] (usually not accepting new patients unless you have family there already, you are always welcome to call and ask)       [facility] [phone]       [place] Pediatricians/Family Doctors:   [facility]: [phone]  [facility]: [phone]  [facility]: [phone]  [facility] Doctors:   [facility]: [phone]   [facility]: [phone]  [facility] Doctors:  [facility]: [phone]   Intrauterine Device Information An intrauterine device (IUD) is a medical device that is inserted in the  uterus to prevent pregnancy. It is a small, T-shaped device that has one or two nylon strings hanging down from it. The strings hang out of the lower part of the uterus (cervix) to allow for future IUD removal. There are two types of IUDs available:  Hormone IUD. This type of IUD is made of plastic and contains the hormone progestin (synthetic progesterone). A hormone IUD may last 3-5 years.  Copper IUD. This type of IUD has copper wire wrapped around it. A copper IUD may last up to 10 years. How is an IUD inserted? An IUD is inserted through the vagina and placed into the uterus with a minor medical procedure. The exact procedure for IUD insertion may vary among health care providers and hospitals. How does an IUD work? Synthetic progesterone in a hormonal IUD prevents pregnancy by:  Thickening cervical mucus to prevent sperm from entering the uterus.  Thinning the uterine lining to prevent a fertilized egg from being implanted there. Copper in a copper IUD prevents pregnancy by making the uterus and fallopian tubes produce a fluid that kills sperm. What are the advantages of an IUD? Advantages of either type of IUD  It is highly effective in preventing pregnancy.  It is reversible. You can become pregnant shortly after the IUD is removed.  It is low-maintenance and can stay in place for a long time.  There are no estrogen-related side effects.  It can be used when breastfeeding.  It is not associated with weight gain.  It can be inserted right after childbirth, an abortion, or a miscarriage. Advantages of a hormone IUD  If it is inserted within 7 days of your period starting, it works right after it is inserted. If the hormone IUD is inserted at any other time in your cycle, you will need to use a backup method of birth control for 7 days after insertion.  It can make menstrual periods lighter.  It can reduce menstrual cramping.  It can be used for 3-5 years. Advantages of a  copper IUD  It works right after it is inserted.  It can be used as a form of emergency birth control if it is inserted within 5 days after having unprotected sex.  It does not interfere with your body's natural hormones.  It can be used for 10 years. What are the disadvantages of an IUD?  An IUD may cause irregular menstrual bleeding for a period of time after insertion.  You may have pain during insertion and have cramping and vaginal bleeding after insertion.  An IUD may cut the uterus (uterine perforation) when it is inserted. This is rare.  An IUD may cause pelvic inflammatory disease (PID), which is an infection in the uterus and fallopian tubes. This is rare, and it usually happens during the first 20 days after the IUD is inserted.  A copper IUD can make your menstrual flow heavier and more painful. How is an IUD removed?  You will lie on your back with your knees bent and your feet in footrests (stirrups).  A device will be inserted into your vagina to spread apart the vaginal walls (speculum). This will allow your health care provider to see the strings attached to the IUD.  Your health care provider will use a small instrument (forceps) to grasp the IUD strings and pull firmly until the IUD is removed. You may have some discomfort when the IUD is removed. Your health care provider may recommend taking over-the-counter pain relievers, such as ibuprofen, before the procedure. You may also have minor spotting for a few days after the procedure. The exact procedure for IUD removal may vary among health care providers and hospitals. Is the IUD right for me? Your health care provider will make sure you are a good candidate for an IUD and will discuss the advantages, disadvantages, and possible side effects with you. Summary  An intrauterine device (IUD) is  a medical device that is inserted in the uterus to prevent pregnancy. It is a small, T-shaped device that has one or two  nylon strings hanging down from it.  A hormone IUD contains the hormone progestin (synthetic progesterone). A copper IUD has copper wire wrapped around it.  Synthetic progesterone in a hormone IUD prevents pregnancy by thickening cervical mucus and thinning the walls of the uterus. Copper in a copper IUD prevents pregnancy by making the uterus and fallopian tubes produce a fluid that kills sperm.  A hormone IUD can be left in place for 3-5 years. A copper IUD can be left in place for up to 10 years.  An IUD is inserted and removed by a health care provider. You may feel some pain during insertion and removal. Your health care provider may recommend taking over-the-counter pain medicine, such as ibuprofen, before an IUD procedure. This information is not intended to replace advice given to you by your health care provider. Make sure you discuss any questions you have with your health care provider. Document Released: 09/29/2004 Document Revised: 11/24/2016 Document Reviewed: 11/24/2016 Elsevier Interactive Patient Education  2019 [employer]. Levonorgestrel intrauterine device (IUD) What is this medicine? LEVONORGESTREL IUD (LEE voe nor jes trel) is a contraceptive (birth control) device. The device is placed inside the uterus by a healthcare professional. It is used to prevent pregnancy. This device can also be used to treat heavy bleeding that occurs during your period. This medicine may be used for other purposes; ask your health care provider or pharmacist if you have questions. COMMON BRAND NAME(S): Cameron Ali What should I tell my health care provider before I take this medicine? They need to know if you have any of these conditions: -abnormal Pap smear -cancer of the breast, uterus, or cervix -diabetes -endometritis -genital or pelvic infection now or in the past -have more than one sexual partner or your partner has more than one partner -heart disease -history  of an ectopic or tubal pregnancy -immune system problems -IUD in place -liver disease or tumor -problems with blood clots or take blood-thinners -seizures -use intravenous drugs -uterus of unusual shape -vaginal bleeding that has not been explained -an unusual or allergic reaction to levonorgestrel, other hormones, silicone, or polyethylene, medicines, foods, dyes, or preservatives -pregnant or trying to get pregnant -breast-feeding How should I use this medicine? This device is placed inside the uterus by a health care professional. Talk to your pediatrician regarding the use of this medicine in children. Special care may be needed. Overdosage: If you think you have taken too much of this medicine contact a poison control center or emergency room at once. NOTE: This medicine is only for you. Do not share this medicine with others. What if I miss a dose? This does not apply. Depending on the brand of device you have inserted, the device will need to be replaced every 3 to 5 years if you wish to continue using this type of birth control. What may interact with this medicine? Do not take this medicine with any of the following medications: -amprenavir -bosentan -fosamprenavir This medicine may also interact with the following medications: -aprepitant -armodafinil -barbiturate medicines for inducing sleep or treating seizures -bexarotene -boceprevir -griseofulvin -medicines to treat seizures like carbamazepine, ethotoin, felbamate, oxcarbazepine, phenytoin, topiramate -modafinil -pioglitazone -rifabutin -rifampin -rifapentine -some medicines to treat HIV infection like atazanavir, efavirenz, indinavir, lopinavir, nelfinavir, tipranavir, ritonavir -St. John's wort -warfarin This list may not describe all possible interactions. Give  your health care provider a list of all the medicines, herbs, non-prescription drugs, or dietary supplements you use. Also tell them if you smoke,  drink alcohol, or use illegal drugs. Some items may interact with your medicine. What should I watch for while using this medicine? Visit your doctor or health care professional for regular check ups. See your doctor if you or your partner has sexual contact with others, becomes HIV positive, or gets a sexual transmitted disease. This product does not protect you against HIV infection (AIDS) or other sexually transmitted diseases. You can check the placement of the IUD yourself by reaching up to the top of your vagina with clean fingers to feel the threads. Do not pull on the threads. It is a good habit to check placement after each menstrual period. Call your doctor right away if you feel more of the IUD than just the threads or if you cannot feel the threads at all. The IUD may come out by itself. You may become pregnant if the device comes out. If you notice that the IUD has come out use a backup birth control method like condoms and call your health care provider. Using tampons will not change the position of the IUD and are okay to use during your period. This IUD can be safely scanned with magnetic resonance imaging (MRI) only under specific conditions. Before you have an MRI, tell your healthcare provider that you have an IUD in place, and which type of IUD you have in place. What side effects may I notice from receiving this medicine? Side effects that you should report to your doctor or health care professional as soon as possible: -allergic reactions like skin rash, itching or hives, swelling of the face, lips, or tongue -fever, flu-like symptoms -genital sores -high blood pressure -no menstrual period for 6 weeks during use -pain, swelling, warmth in the leg -pelvic pain or tenderness -severe or sudden headache -signs of pregnancy -stomach cramping -sudden shortness of breath -trouble with balance, talking, or walking -unusual vaginal bleeding, discharge -yellowing of the eyes or  skin Side effects that usually do not require medical attention (report to your doctor or health care professional if they continue or are bothersome): -acne -breast pain -change in sex drive or performance -changes in weight -cramping, dizziness, or faintness while the device is being inserted -headache -irregular menstrual bleeding within first 3 to 6 months of use -nausea This list may not describe all possible side effects. Call your doctor for medical advice about side effects. You may report side effects to FDA at 1-800-FDA-1088. Where should I keep my medicine? This does not apply. NOTE: This sheet is a summary. It may not cover all possible information. If you have questions about this medicine, talk to your doctor, pharmacist, or health care provider.  2019 Elsevier/Gold Standard (2016-08-07 14:14:56)

## 2018-12-02 ENCOUNTER — Encounter: Payer: Self-pay | Admitting: Women's Health

## 2018-12-02 ENCOUNTER — Ambulatory Visit (INDEPENDENT_AMBULATORY_CARE_PROVIDER_SITE_OTHER): Payer: 59 | Admitting: Women's Health

## 2018-12-02 VITALS — BP 150/90 | HR 99 | Wt 270.8 lb

## 2018-12-02 DIAGNOSIS — O0990 Supervision of high risk pregnancy, unspecified, unspecified trimester: Secondary | ICD-10-CM

## 2018-12-02 DIAGNOSIS — Z3A33 33 weeks gestation of pregnancy: Secondary | ICD-10-CM

## 2018-12-02 DIAGNOSIS — Z331 Pregnant state, incidental: Secondary | ICD-10-CM

## 2018-12-02 DIAGNOSIS — O133 Gestational [pregnancy-induced] hypertension without significant proteinuria, third trimester: Secondary | ICD-10-CM

## 2018-12-02 DIAGNOSIS — Z8759 Personal history of other complications of pregnancy, childbirth and the puerperium: Secondary | ICD-10-CM | POA: Insufficient documentation

## 2018-12-02 DIAGNOSIS — Z1389 Encounter for screening for other disorder: Secondary | ICD-10-CM

## 2018-12-02 DIAGNOSIS — O0993 Supervision of high risk pregnancy, unspecified, third trimester: Secondary | ICD-10-CM

## 2018-12-02 LAB — POCT URINALYSIS DIPSTICK OB
Blood, UA: NEGATIVE
Glucose, UA: NEGATIVE
Ketones, UA: NEGATIVE
LEUKOCYTES UA: NEGATIVE
Nitrite, UA: NEGATIVE
POC,PROTEIN,UA: NEGATIVE

## 2018-12-02 NOTE — Progress Notes (Signed)
HIGH-RISK PREGNANCY VISIT Patient name: Meghan Burton MRN [id number]  Date of birth: 17-Dec-1995 Chief Complaint:   follow-up-ob (b/p check)  History of Present Illness:   Meghan Burton is a 23 y.o. G1P0 female at [redacted]w[redacted]d with an Estimated Date of Delivery: 01/18/19 being seen today for ongoing management of a high-risk pregnancy complicated by gestational HTN dx today Today she reports no complaints. Denies ha, visual changes, ruq/epigastric pain, n/v.   Contractions: Not present.  .  Movement: Present. denies leaking of fluid.  Review of Systems:   Pertinent items are noted in HPI Denies abnormal vaginal discharge w/ itching/odor/irritation, headaches, visual changes, shortness of breath, chest pain, abdominal pain, severe nausea/vomiting, or problems with urination or bowel movements unless otherwise stated above. Pertinent History Reviewed:  Reviewed past medical,surgical, social, obstetrical and family history.  Reviewed problem list, medications and allergies. Physical Assessment:   Vitals:   12/02/18 0849  BP: (!) 150/90  Pulse: 99  Weight: 270 lb 12.8 oz (122.8 kg)  Body mass index is 43.71 kg/m.           Physical Examination:   General appearance: alert, well appearing, and in no distress  Mental status: alert, oriented to person, place, and time  Skin: warm & dry   Extremities: Edema: None    Cardiovascular: normal heart rate noted  Respiratory: normal respiratory effort, no distress  Abdomen: gravid, soft, non-tender  Pelvic: Cervical exam deferred         Fetal Status:   Fundal Height: 33 cm Movement: Present    Fetal Surveillance Testing today: NST: FHR baseline 135 bpm, Variability: moderate, Accelerations:present, Decelerations:  Absent= Cat 1/Reactive Toco: none     Results for orders placed or performed in visit on 12/02/18 (from the past 24 hour(s))  POC Urinalysis Dipstick OB   Collection Time: 12/02/18  8:53 AM  Result Value Ref Range   Color, UA     Clarity, UA     Glucose, UA Negative Negative   Bilirubin, UA     Ketones, UA neg    Spec Grav, UA     Blood, UA neg    pH, UA     POC,PROTEIN,UA Negative Negative, Trace, Small (1+), Moderate (2+), Large (3+), 4+   Urobilinogen, UA     Nitrite, UA neg    Leukocytes, UA Negative Negative   Appearance     Odor      Assessment & Plan:  1) High-risk pregnancy G1P0 at [redacted]w[redacted]d with an Estimated Date of Delivery: 01/18/19   2) GHTN, dx today, asymptomatic, will get pre-e labs. Reviewed and gave printed info on pre-e s/s, reasons to seek care. No meds needed right now per [facility]  3) Excessive weight gain, 72lbs  Meds: No orders of the defined types were placed in this encounter.  Labs/procedures today: nst  Treatment Plan:  Growth u/s q 4wks     2x/wk testing nst alt w/ bpp/dopp  Deliver @ 37-38wks  Reviewed: Preterm labor symptoms and general obstetric precautions including but not limited to vaginal bleeding, contractions, leaking of fluid and fetal movement were reviewed in detail with the patient.  All questions were answered.  Follow-up: Return for Tues efw/bpp/dopp u/s and hrob; Frid hrob/nst ending 2/18.  Orders Placed This Encounter  Procedures  . US OB Follow Up  . US FETAL BPP WO NON STRESS  . [place] UA Cord Doppler  . CBC  . Comprehensive metabolic panel  . Protein / creatinine ratio, urine  .  POC Urinalysis Dipstick OB   Cheral Marker CNM, [facility] 12/02/2018 11:41 AM

## 2018-12-02 NOTE — Patient Instructions (Signed)
Meghan Burton, I greatly value your feedback.  If you receive a survey following your visit with us today, we appreciate you taking the time to fill it out.  Thanks, Joellyn HaffKim Booker, CNM, WHNP-BC   Call the office [phone]) or go to [facility] if:  You begin to have strong, frequent contractions  Your water breaks.  Sometimes it is a big gush of fluid, sometimes it is just a trickle that keeps getting your panties wet or running down your legs  You have vaginal bleeding.  It is normal to have a small amount of spotting if your cervix was checked.   You don't feel your baby moving like normal.  If you don't, get you something to eat and drink and lay down and focus on feeling your baby move.  You should feel at least 10 movements in 2 hours.  If you don't, you should call the office or go to [facility].    Call the office [phone]) or [facility] to [facility] for these signs of pre-eclampsia:  Severe headache that does not go away with Tylenol  Visual changes- seeing spots, double, blurred vision  Pain under your right breast or upper abdomen that does not go away with Tums or heartburn medicine  Nausea and/or vomiting  Severe swelling in your hands, feet, and face      Preeclampsia and Eclampsia  Preeclampsia is a serious condition that may develop during pregnancy. It is also called toxemia of pregnancy. This condition causes high blood pressure along with other symptoms, such as swelling and headaches. These symptoms may develop as the condition gets worse. Preeclampsia may occur at 20 weeks of pregnancy or later. Diagnosing and treating preeclampsia early is very important. If not treated early, it can cause serious problems for you and your baby. One problem it can lead to is eclampsia. Eclampsia is a condition that causes muscle jerking or shaking (convulsions or seizures) and other serious problems for the mother. During pregnancy, delivering your baby may be the best  treatment for preeclampsia or eclampsia. For most women, preeclampsia and eclampsia symptoms go away after giving birth. In rare cases, a woman may develop preeclampsia after giving birth (postpartum preeclampsia). This usually occurs within 48 hours after childbirth but may occur up to 6 weeks after giving birth. What are the causes? The cause of preeclampsia is not known. What increases the risk? The following risk factors make you more likely to develop preeclampsia:  Being pregnant for the first time.  Having had preeclampsia during a past pregnancy.  Having a family history of preeclampsia.  Having high blood pressure.  Being pregnant with more than one baby.  Being 8735 or older.  Being African-American.  Having kidney disease or diabetes.  Having medical conditions such as lupus or blood diseases.  Being very overweight (obese). What are the signs or symptoms? The earliest signs of preeclampsia are:  High blood pressure.  Increased protein in your urine. Your health care provider will check for this at every visit before you give birth (prenatal visit). Other symptoms that may develop as the condition gets worse include:  Severe headaches.  Sudden weight gain.  Swelling of the hands, face, legs, and feet.  Nausea and vomiting.  Vision problems, such as blurred or double vision.  Numbness in the face, arms, legs, and feet.  Urinating less than usual.  Dizziness.  Slurred speech.  Abdominal pain, especially upper abdominal pain.  Convulsions or seizures. How is this diagnosed?  There are no screening tests for preeclampsia. Your health care provider will ask you about symptoms and check for signs of preeclampsia during your prenatal visits. You may also have tests that include:  Urine tests.  Blood tests.  Checking your blood pressure.  Monitoring your baby's heart rate.  Ultrasound. How is this treated? You and your health care provider will  determine the treatment approach that is best for you. Treatment may include:  Having more frequent prenatal exams to check for signs of preeclampsia, if you have an increased risk for preeclampsia.  Medicine to lower your blood pressure.  Staying in the hospital, if your condition is severe. There, treatment will focus on controlling your blood pressure and the amount of fluids in your body (fluid retention).  Taking medicine (magnesium sulfate) to prevent seizures. This may be given as an injection or through an IV.  Taking a low-dose aspirin during your pregnancy.  Delivering your baby early, if your condition gets worse. You may have your labor started with medicine (induced), or you may have a cesarean delivery. Follow these instructions at home: Eating and drinking   Drink enough fluid to keep your urine pale yellow.  Avoid caffeine. Lifestyle  Do not use any products that contain nicotine or tobacco, such as cigarettes and e-cigarettes. If you need help quitting, ask your health care provider.  Do not use alcohol or drugs.  Avoid stress as much as possible. Rest and get plenty of sleep. General instructions  Take over-the-counter and prescription medicines only as told by your health care provider.  When lying down, lie on your left side. This keeps pressure off your major blood vessels.  When sitting or lying down, raise (elevate) your feet. Try putting some pillows underneath your lower legs.  Exercise regularly. Ask your health care provider what kinds of exercise are best for you.  Keep all follow-up and prenatal visits as told by your health care provider. This is important. How is this prevented? There is no known way of preventing preeclampsia or eclampsia from developing. However, to lower your risk of complications and detect problems early:  Get regular prenatal care. Your health care provider may be able to diagnose and treat the condition early.  Maintain  a healthy weight. Ask your health care provider for help managing weight gain during pregnancy.  Work with your health care provider to manage any long-term (chronic) health conditions you have, such as diabetes or kidney problems.  You may have tests of your blood pressure and kidney function after giving birth.  Your health care provider may have you take low-dose aspirin during your next pregnancy. Contact a health care provider if:  You have symptoms that your health care provider told you may require more treatment or monitoring, such as: ? Headaches. ? Nausea or vomiting. ? Abdominal pain. ? Dizziness. ? Light-headedness. Get help right away if:  You have severe: ? Abdominal pain. ? Headaches that do not get better. ? Dizziness. ? Vision problems. ? Confusion. ? Nausea or vomiting.  You have any of the following: ? A seizure. ? Sudden, rapid weight gain. ? Sudden swelling in your hands, ankles, or face. ? Trouble moving any part of your body. ? Numbness in any part of your body. ? Trouble speaking. ? Abnormal bleeding.  You faint. Summary  Preeclampsia is a serious condition that may develop during pregnancy. It is also called toxemia of pregnancy.  This condition causes high blood pressure along with other  symptoms, such as swelling and headaches.  Diagnosing and treating preeclampsia early is very important. If not treated early, it can cause serious problems for you and your baby.  Get help right away if you have symptoms that your health care provider told you to watch for. This information is not intended to replace advice given to you by your health care provider. Make sure you discuss any questions you have with your health care provider. Document Released: 10/23/2000 Document Revised: 10/12/2017 Document Reviewed: 06/01/2016 Elsevier Interactive Patient Education  2019 [employer].

## 2018-12-03 LAB — COMPREHENSIVE METABOLIC PANEL
ALT: 14 IU/L (ref 0–32)
AST: 16 IU/L (ref 0–40)
Albumin/Globulin Ratio: 1.2 (ref 1.2–2.2)
Albumin: 3.2 g/dL — ABNORMAL LOW (ref 3.9–5.0)
Alkaline Phosphatase: 153 IU/L — ABNORMAL HIGH (ref 39–117)
BUN/Creatinine Ratio: 14 (ref 9–23)
BUN: 8 mg/dL (ref 6–20)
Bilirubin Total: <0.2 mg/dL (ref 0.0–1.2)
CO2: 16 mmol/L — ABNORMAL LOW (ref 20–29)
Calcium: 9.1 mg/dL (ref 8.7–10.2)
Chloride: 104 mmol/L (ref 96–106)
Creatinine, Ser: 0.58 mg/dL (ref 0.57–1.00)
GFR calc non Af Amer: 131 mL/min/{1.73_m2} (ref >59)
GFR, EST AFRICAN AMERICAN: 151 mL/min/{1.73_m2} (ref >59)
Globulin, Total: 2.7 g/dL (ref 1.5–4.5)
Glucose: 70 mg/dL (ref 65–99)
Potassium: 4.2 mmol/L (ref 3.5–5.2)
Sodium: 136 mmol/L (ref 134–144)
TOTAL PROTEIN: 5.9 g/dL — AB (ref 6.0–8.5)

## 2018-12-03 LAB — CBC
Hematocrit: 34.5 % (ref 34.0–46.6)
Hemoglobin: 11.2 g/dL (ref 11.1–15.9)
MCH: 24.7 pg — ABNORMAL LOW (ref 26.6–33.0)
MCHC: 32.5 g/dL (ref 31.5–35.7)
MCV: 76 fL — ABNORMAL LOW (ref 79–97)
Platelets: 281 10*3/uL (ref 150–450)
RBC: 4.54 x10E6/uL (ref 3.77–5.28)
RDW: 14.3 % (ref 11.7–15.4)
WBC: 12.5 10*3/uL — ABNORMAL HIGH (ref 3.4–10.8)

## 2018-12-03 LAB — PROTEIN / CREATININE RATIO, URINE
Creatinine, Urine: 112.4 mg/dL
Protein, Ur: 24 mg/dL
Protein/Creat Ratio: 214 mg/g creat — ABNORMAL HIGH (ref 0–200)

## 2018-12-06 ENCOUNTER — Ambulatory Visit (INDEPENDENT_AMBULATORY_CARE_PROVIDER_SITE_OTHER): Payer: 59

## 2018-12-06 ENCOUNTER — Ambulatory Visit (INDEPENDENT_AMBULATORY_CARE_PROVIDER_SITE_OTHER): Payer: 59 | Admitting: Obstetrics & Gynecology

## 2018-12-06 ENCOUNTER — Encounter: Payer: Self-pay | Admitting: Obstetrics & Gynecology

## 2018-12-06 VITALS — BP 132/88 | HR 101 | Wt 269.0 lb

## 2018-12-06 DIAGNOSIS — O0993 Supervision of high risk pregnancy, unspecified, third trimester: Secondary | ICD-10-CM | POA: Diagnosis not present

## 2018-12-06 DIAGNOSIS — O133 Gestational [pregnancy-induced] hypertension without significant proteinuria, third trimester: Secondary | ICD-10-CM

## 2018-12-06 DIAGNOSIS — Z331 Pregnant state, incidental: Secondary | ICD-10-CM

## 2018-12-06 DIAGNOSIS — O0990 Supervision of high risk pregnancy, unspecified, unspecified trimester: Secondary | ICD-10-CM

## 2018-12-06 DIAGNOSIS — Z3A33 33 weeks gestation of pregnancy: Secondary | ICD-10-CM

## 2018-12-06 DIAGNOSIS — Z1389 Encounter for screening for other disorder: Secondary | ICD-10-CM

## 2018-12-06 LAB — POCT URINALYSIS DIPSTICK OB
Glucose, UA: NEGATIVE
Ketones, UA: NEGATIVE
Leukocytes, UA: NEGATIVE
Nitrite, UA: NEGATIVE
POC,PROTEIN,UA: NEGATIVE

## 2018-12-06 NOTE — Progress Notes (Signed)
[place] 33+6 wks,cephalic,fhr 146 bpm,fundal placenta gr 3,normal ovaries bilat,BPP 8/8,AFI 14 cm,RI .57,.52,.60,.57=35%,EFW 2274 g 41%

## 2018-12-06 NOTE — Progress Notes (Signed)
   HIGH-RISK PREGNANCY VISIT Patient name: Meghan Burton MRN [id number]  Date of birth: September 20, 1996 Chief Complaint:   High Risk Gestation ([place] today)  History of Present Illness:   Meghan Burton is a 23 y.o. G1P0 female at [redacted]w[redacted]d with an Estimated Date of Delivery: 01/18/19 being seen today for ongoing management of a high-risk pregnancy complicated by gestational HTN.  Today she reports no complaints. Contractions: Not present. Vag. Bleeding: None.  Movement: Present. denies leaking of fluid.  Review of Systems:   Pertinent items are noted in HPI Denies abnormal vaginal discharge w/ itching/odor/irritation, headaches, visual changes, shortness of breath, chest pain, abdominal pain, severe nausea/vomiting, or problems with urination or bowel movements unless otherwise stated above. Pertinent History Reviewed:  Reviewed past medical,surgical, social, obstetrical and family history.  Reviewed problem list, medications and allergies. Physical Assessment:   Vitals:   12/06/18 1505  BP: 132/88  Pulse: (!) 101  Weight: 269 lb (122 kg)  Body mass index is 43.42 kg/m.           Physical Examination:   General appearance: alert, well appearing, and in no distress  Mental status: alert, oriented to person, place, and time  Skin: warm & dry   Extremities: Edema: Trace    Cardiovascular: normal heart rate noted  Respiratory: normal respiratory effort, no distress  Abdomen: gravid, soft, non-tender  Pelvic: Cervical exam deferred         Fetal Status:     Movement: Present    Fetal Surveillance Testing today: BPP 8/8 with normal Dopplers, normal growth    Results for orders placed or performed in visit on 12/06/18 (from the past 24 hour(s))  POC Urinalysis Dipstick OB   Collection Time: 12/06/18  3:06 PM  Result Value Ref Range   Color, UA     Clarity, UA     Glucose, UA Negative Negative   Bilirubin, UA     Ketones, UA neg    Spec Grav, UA     Blood, UA trace    pH, UA     POC,PROTEIN,UA Negative Negative, Trace, Small (1+), Moderate (2+), Large (3+), 4+   Urobilinogen, UA     Nitrite, UA neg    Leukocytes, UA Negative Negative   Appearance     Odor      Assessment & Plan:  1) High-risk pregnancy G1P0 at [redacted]w[redacted]d with an Estimated Date of Delivery: 01/18/19   2) GHTN, stable, no meds, continue twice weekly surveillance,    Meds: No orders of the defined types were placed in this encounter.   Labs/procedures today: BPP 8/8  Treatment Plan:  Twice weekly surveillance, sonogram alternating with NST, induction at 37-39 weeks or as clinically indicated  Reviewed: Preterm labor symptoms and general obstetric precautions including but not limited to vaginal bleeding, contractions, leaking of fluid and fetal movement were reviewed in detail with the patient.  All questions were answered.  Follow-up: Return in about 3 days (around 12/09/2018) for NST, HROB.  Orders Placed This Encounter  Procedures  . POC Urinalysis Dipstick OB   Lazaro Arms  12/06/2018 3:28 PM

## 2018-12-09 ENCOUNTER — Encounter: Payer: Self-pay | Admitting: Obstetrics and Gynecology

## 2018-12-09 ENCOUNTER — Ambulatory Visit (INDEPENDENT_AMBULATORY_CARE_PROVIDER_SITE_OTHER): Payer: 59 | Admitting: Obstetrics and Gynecology

## 2018-12-09 VITALS — BP 139/83 | HR 100 | Wt 272.8 lb

## 2018-12-09 DIAGNOSIS — O133 Gestational [pregnancy-induced] hypertension without significant proteinuria, third trimester: Secondary | ICD-10-CM

## 2018-12-09 DIAGNOSIS — Z3A34 34 weeks gestation of pregnancy: Secondary | ICD-10-CM

## 2018-12-09 DIAGNOSIS — Z1389 Encounter for screening for other disorder: Secondary | ICD-10-CM

## 2018-12-09 DIAGNOSIS — O0993 Supervision of high risk pregnancy, unspecified, third trimester: Secondary | ICD-10-CM

## 2018-12-09 DIAGNOSIS — Z331 Pregnant state, incidental: Secondary | ICD-10-CM

## 2018-12-09 DIAGNOSIS — O0990 Supervision of high risk pregnancy, unspecified, unspecified trimester: Secondary | ICD-10-CM

## 2018-12-09 LAB — POCT URINALYSIS DIPSTICK OB
Blood, UA: NEGATIVE
Glucose, UA: NEGATIVE
Ketones, UA: NEGATIVE
Leukocytes, UA: NEGATIVE
NITRITE UA: NEGATIVE
POC,PROTEIN,UA: NEGATIVE

## 2018-12-09 NOTE — Progress Notes (Signed)
Patient ID: Meghan Burton, female   DOB: [birth year], 23 y.o.   MRN: [id number]    [facility] PREGNANCY VISIT Patient name: Meghan Burton MRN [id number]  Date of birth: 19-Nov-1995 Chief Complaint:   High Risk Gestation (NST)  History of Present Illness:   Meghan Burton is a 23 y.o. G1P0 female at [redacted]w[redacted]d with an Estimated Date of Delivery: 01/18/19 being seen today for ongoing management of a high-risk pregnancy complicated by gestational HTN.  Today she reports no complaints. Contractions: Not present. Vag. Bleeding: None.  Movement: Present. denies leaking of fluid.  Review of Systems:   Pertinent items are noted in HPI Denies abnormal vaginal discharge w/ itching/odor/irritation, headaches, visual changes, shortness of breath, chest pain, abdominal pain, severe nausea/vomiting, or problems with urination or bowel movements unless otherwise stated above. Pertinent History Reviewed:  Reviewed past medical,surgical, social, obstetrical and family history.  Reviewed problem list, medications and allergies. Physical Assessment:   Vitals:   12/09/18 1013 12/09/18 1016  BP: (!) 160/84 139/83  Pulse: 93 100  Weight: 272 lb 12.8 oz (123.7 kg)   Body mass index is 44.03 kg/m.           Physical Examination:   General appearance: alert, well appearing, and in no distress and oriented to person, place, and time  Mental status: alert, oriented to person, place, and time, normal mood, behavior, speech, dress, motor activity, and thought processes, affect appropriate to mood  Skin: warm & dry   Extremities: Edema: Trace    Cardiovascular: normal heart rate noted  Respiratory: normal respiratory effort, no distress  Abdomen: gravid, soft, non-tender  Pelvic: Cervical exam deferred         Fetal Status:   Fundal Height: 35 cm Movement: Present    Fetal Surveillance Testing today: NST reactive  Results for orders placed or performed in visit on 12/09/18 (from the past 24 hour(s))  POC Urinalysis  Dipstick OB   Collection Time: 12/09/18 10:15 AM  Result Value Ref Range   Color, UA     Clarity, UA     Glucose, UA Negative Negative   Bilirubin, UA     Ketones, UA neg    Spec Grav, UA     Blood, UA neg    pH, UA     POC,PROTEIN,UA Negative Negative, Trace, Small (1+), Moderate (2+), Large (3+), 4+   Urobilinogen, UA     Nitrite, UA neg    Leukocytes, UA Negative Negative   Appearance     Odor      Assessment & Plan:  1) High-risk pregnancy G1P0 at [redacted]w[redacted]d with an Estimated Date of Delivery: 01/18/19   2) Gestational HTN, stable, continue to monitor twice weekly alternating BPP and NST.   Meds: No orders of the defined types were placed in this encounter.   Labs/procedures today: NST, reactive  Treatment Plan:  1. F/u for weekly BPP & NST 12/13/2018 2. Continue to monitor BP 3. Dr note for maternity leave  Follow-up: twice weekly  Orders Placed This Encounter  Procedures  . POC Urinalysis Dipstick OB   By signing my name below, I, Arnette Norris, attest that this documentation has been prepared under the direction and in the presence of Tilda Burrow, MD. Electronically Signed: Arnette Norris Medical Scribe. 12/09/18. 10:53 AM.  I personally performed the services described in this documentation, which was SCRIBED in my presence. The recorded information has been reviewed and considered accurate. It has been edited as necessary during review. Jonny Ruiz  Benancio DeedsV Khyli Swaim, MD

## 2018-12-12 ENCOUNTER — Other Ambulatory Visit: Payer: Self-pay | Admitting: Obstetrics and Gynecology

## 2018-12-12 DIAGNOSIS — O10919 Unspecified pre-existing hypertension complicating pregnancy, unspecified trimester: Secondary | ICD-10-CM

## 2018-12-12 DIAGNOSIS — O26613 Liver and biliary tract disorders in pregnancy, third trimester: Principal | ICD-10-CM

## 2018-12-12 DIAGNOSIS — K831 Obstruction of bile duct: Secondary | ICD-10-CM

## 2018-12-13 ENCOUNTER — Ambulatory Visit (INDEPENDENT_AMBULATORY_CARE_PROVIDER_SITE_OTHER): Payer: 59

## 2018-12-13 ENCOUNTER — Ambulatory Visit (INDEPENDENT_AMBULATORY_CARE_PROVIDER_SITE_OTHER): Payer: 59 | Admitting: Obstetrics & Gynecology

## 2018-12-13 ENCOUNTER — Encounter (INDEPENDENT_AMBULATORY_CARE_PROVIDER_SITE_OTHER): Payer: Self-pay

## 2018-12-13 ENCOUNTER — Encounter: Payer: Self-pay | Admitting: Obstetrics & Gynecology

## 2018-12-13 ENCOUNTER — Other Ambulatory Visit: Payer: Self-pay

## 2018-12-13 VITALS — BP 127/86 | HR 80 | Wt 272.0 lb

## 2018-12-13 DIAGNOSIS — Z1389 Encounter for screening for other disorder: Secondary | ICD-10-CM

## 2018-12-13 DIAGNOSIS — O0990 Supervision of high risk pregnancy, unspecified, unspecified trimester: Secondary | ICD-10-CM

## 2018-12-13 DIAGNOSIS — O10919 Unspecified pre-existing hypertension complicating pregnancy, unspecified trimester: Secondary | ICD-10-CM

## 2018-12-13 DIAGNOSIS — O0993 Supervision of high risk pregnancy, unspecified, third trimester: Secondary | ICD-10-CM

## 2018-12-13 DIAGNOSIS — O133 Gestational [pregnancy-induced] hypertension without significant proteinuria, third trimester: Secondary | ICD-10-CM

## 2018-12-13 DIAGNOSIS — Z3A34 34 weeks gestation of pregnancy: Secondary | ICD-10-CM

## 2018-12-13 DIAGNOSIS — Z331 Pregnant state, incidental: Secondary | ICD-10-CM

## 2018-12-13 LAB — POCT URINALYSIS DIPSTICK OB
Blood, UA: NEGATIVE
GLUCOSE, UA: NEGATIVE
Ketones, UA: NEGATIVE
Leukocytes, UA: NEGATIVE
Nitrite, UA: NEGATIVE
POC,PROTEIN,UA: NEGATIVE

## 2018-12-13 NOTE — Progress Notes (Signed)
   HIGH-RISK PREGNANCY VISIT Patient name: Meghan Burton MRN [id number]  Date of birth: 1995/12/21 Chief Complaint:   High Risk Gestation (u/s today)  History of Present Illness:   Meghan Burton is a 23 y.o. G1P0 female at [redacted]w[redacted]d with an Estimated Date of Delivery: 01/18/19 being seen today for ongoing management of a high-risk pregnancy complicated by gestational HTN.  Today she reports no complaints. Contractions: Not present. Vag. Bleeding: None.  Movement: Present. denies leaking of fluid.  Review of Systems:   Pertinent items are noted in HPI Denies abnormal vaginal discharge w/ itching/odor/irritation, headaches, visual changes, shortness of breath, chest pain, abdominal pain, severe nausea/vomiting, or problems with urination or bowel movements unless otherwise stated above. Pertinent History Reviewed:  Reviewed past medical,surgical, social, obstetrical and family history.  Reviewed problem list, medications and allergies. Physical Assessment:   Vitals:   12/13/18 0857  BP: 127/86  Pulse: 80  Weight: 272 lb (123.4 kg)  Body mass index is 43.9 kg/m.           Physical Examination:   General appearance: alert, well appearing, and in no distress  Mental status: alert, oriented to person, place, and time  Skin: warm & dry   Extremities: Edema: None    Cardiovascular: normal heart rate noted  Respiratory: normal respiratory effort, no distress  Abdomen: gravid, soft, non-tender  Pelvic: Cervical exam deferred         Fetal Status:     Movement: Present    Fetal Surveillance Testing today: BPP 8/8 with excellent Dopplers, see report   Results for orders placed or performed in visit on 12/13/18 (from the past 24 hour(s))  POC Urinalysis Dipstick OB   Collection Time: 12/13/18  9:00 AM  Result Value Ref Range   Color, UA     Clarity, UA     Glucose, UA Negative Negative   Bilirubin, UA     Ketones, UA neg    Spec Grav, UA     Blood, UA neg    pH, UA     POC,PROTEIN,UA  Negative Negative, Trace, Small (1+), Moderate (2+), Large (3+), 4+   Urobilinogen, UA     Nitrite, UA neg    Leukocytes, UA Negative Negative   Appearance     Odor      Assessment & Plan:  1) High-risk pregnancy G1P0 at [redacted]w[redacted]d with an Estimated Date of Delivery: 01/18/19   2) GHTN, stable, BP stable today, with reassuring fetal surveillance    Meds: No orders of the defined types were placed in this encounter.   Labs/procedures today:   Treatment Plan:  Twice weekly surveillance with delivery decision based on clinical course, 37-39 weeks or as clinically indicated  Reviewed: Preterm labor symptoms and general obstetric precautions including but not limited to vaginal bleeding, contractions, leaking of fluid and fetal movement were reviewed in detail with the patient.  All questions were answered.  Follow-up: Return in about 3 days (around 12/16/2018) for NST, HROB.  Orders Placed This Encounter  Procedures  . POC Urinalysis Dipstick OB   Lazaro Arms 12/13/2018 9:23 AM

## 2018-12-13 NOTE — Progress Notes (Signed)
[place] 34+6 wks,cephalic,BPP 8/8,fundal placenta gr 3,bilat adnexa's wnl,afi 13 cm,fhr 137 bpm,RI .54,.57,.55=34%

## 2018-12-16 ENCOUNTER — Ambulatory Visit (INDEPENDENT_AMBULATORY_CARE_PROVIDER_SITE_OTHER): Payer: 59 | Admitting: Obstetrics & Gynecology

## 2018-12-16 ENCOUNTER — Encounter (INDEPENDENT_AMBULATORY_CARE_PROVIDER_SITE_OTHER): Payer: Self-pay

## 2018-12-16 ENCOUNTER — Other Ambulatory Visit: Payer: Self-pay

## 2018-12-16 ENCOUNTER — Encounter: Payer: Self-pay | Admitting: Obstetrics & Gynecology

## 2018-12-16 VITALS — BP 145/85 | HR 91 | Wt 274.0 lb

## 2018-12-16 DIAGNOSIS — Z3A35 35 weeks gestation of pregnancy: Secondary | ICD-10-CM

## 2018-12-16 DIAGNOSIS — Z1389 Encounter for screening for other disorder: Secondary | ICD-10-CM

## 2018-12-16 DIAGNOSIS — Z331 Pregnant state, incidental: Secondary | ICD-10-CM

## 2018-12-16 DIAGNOSIS — O0993 Supervision of high risk pregnancy, unspecified, third trimester: Secondary | ICD-10-CM | POA: Diagnosis not present

## 2018-12-16 DIAGNOSIS — O133 Gestational [pregnancy-induced] hypertension without significant proteinuria, third trimester: Secondary | ICD-10-CM

## 2018-12-16 LAB — POCT URINALYSIS DIPSTICK OB
Blood, UA: NEGATIVE
Glucose, UA: NEGATIVE
Ketones, UA: NEGATIVE
Leukocytes, UA: NEGATIVE
Nitrite, UA: NEGATIVE
POC,PROTEIN,UA: NEGATIVE

## 2018-12-16 NOTE — Progress Notes (Signed)
   HIGH-RISK PREGNANCY VISIT Patient name: Meghan Burton MRN [id number]  Date of birth: 1996-01-25 Chief Complaint:   High Risk Gestation (NST)  History of Present Illness:   Meghan Burton is a 23 y.o. G1P0 female at 4564w2d with an Estimated Date of Delivery: 01/18/19 being seen today for ongoing management of a high-risk pregnancy complicated by gestational HTN.  Today she reports no complaints. Contractions: Irregular. Vag. Bleeding: None.  Movement: Present. denies leaking of fluid.  Review of Systems:   Pertinent items are noted in HPI Denies abnormal vaginal discharge w/ itching/odor/irritation, headaches, visual changes, shortness of breath, chest pain, abdominal pain, severe nausea/vomiting, or problems with urination or bowel movements unless otherwise stated above. Pertinent History Reviewed:  Reviewed past medical,surgical, social, obstetrical and family history.  Reviewed problem list, medications and allergies. Physical Assessment:   Vitals:   12/16/18 1017  BP: (!) 145/85  Pulse: 91  Weight: 274 lb (124.3 kg)  Body mass index is 44.22 kg/m.           Physical Examination:   General appearance: alert, well appearing, and in no distress  Mental status: alert, oriented to person, place, and time  Skin: warm & dry   Extremities: Edema: Trace    Cardiovascular: normal heart rate noted  Respiratory: normal respiratory effort, no distress  Abdomen: gravid, soft, non-tender  Pelvic: Cervical exam deferred         Fetal Status:     Movement: Present    Fetal Surveillance Testing today: Reactive NST   Results for orders placed or performed in visit on 12/16/18 (from the past 24 hour(s))  POC Urinalysis Dipstick OB   Collection Time: 12/16/18 10:17 AM  Result Value Ref Range   Color, UA     Clarity, UA     Glucose, UA Negative Negative   Bilirubin, UA     Ketones, UA neg    Spec Grav, UA     Blood, UA neg    pH, UA     POC,PROTEIN,UA Negative Negative, Trace, Small  (1+), Moderate (2+), Large (3+), 4+   Urobilinogen, UA     Nitrite, UA neg    Leukocytes, UA Negative Negative   Appearance     Odor      Assessment & Plan:  1) High-risk pregnancy G1P0 at 2364w2d with an Estimated Date of Delivery: 01/18/19   2) GHTN, stable, BP is acceptable, negative protein    Meds: No orders of the defined types were placed in this encounter.   Labs/procedures today: Reactive NST  Treatment Plan:  Twice weekly surveillance, sonogram alternating with NST, induction at 37-39 weeks or as clinically indicated   Reviewed: Preterm labor symptoms and general obstetric precautions including but not limited to vaginal bleeding, contractions, leaking of fluid and fetal movement were reviewed in detail with the patient.  All questions were answered.  Follow-up: Return in about 11 days (around 12/27/2018) for BPP/sono, HROB.  Orders Placed This Encounter  Procedures  . POC Urinalysis Dipstick OB   Lazaro ArmsLuther H Eure  12/16/2018 10:48 AM

## 2018-12-19 ENCOUNTER — Other Ambulatory Visit: Payer: Self-pay | Admitting: Obstetrics & Gynecology

## 2018-12-19 DIAGNOSIS — O10919 Unspecified pre-existing hypertension complicating pregnancy, unspecified trimester: Secondary | ICD-10-CM

## 2018-12-20 ENCOUNTER — Encounter: Payer: Self-pay | Admitting: Women's Health

## 2018-12-20 ENCOUNTER — Other Ambulatory Visit: Payer: Self-pay

## 2018-12-20 ENCOUNTER — Ambulatory Visit (INDEPENDENT_AMBULATORY_CARE_PROVIDER_SITE_OTHER): Payer: 59

## 2018-12-20 ENCOUNTER — Telehealth: Payer: Self-pay | Admitting: *Deleted

## 2018-12-20 ENCOUNTER — Inpatient Hospital Stay (HOSPITAL_COMMUNITY)
Admission: AD | Admit: 2018-12-20 | Discharge: 2018-12-20 | Disposition: A | Discharge status: Home / Self Care | Payer: 59 | Attending: Obstetrics & Gynecology | Admitting: Obstetrics & Gynecology

## 2018-12-20 ENCOUNTER — Encounter (HOSPITAL_COMMUNITY): Payer: Self-pay

## 2018-12-20 ENCOUNTER — Ambulatory Visit (INDEPENDENT_AMBULATORY_CARE_PROVIDER_SITE_OTHER): Payer: 59 | Admitting: Women's Health

## 2018-12-20 ENCOUNTER — Other Ambulatory Visit: Payer: Self-pay | Admitting: Certified Nurse Midwife

## 2018-12-20 VITALS — BP 168/94 | HR 103 | Wt 278.0 lb

## 2018-12-20 DIAGNOSIS — O133 Gestational [pregnancy-induced] hypertension without significant proteinuria, third trimester: Secondary | ICD-10-CM

## 2018-12-20 DIAGNOSIS — Z3A35 35 weeks gestation of pregnancy: Secondary | ICD-10-CM | POA: Diagnosis not present

## 2018-12-20 DIAGNOSIS — Z1389 Encounter for screening for other disorder: Secondary | ICD-10-CM

## 2018-12-20 DIAGNOSIS — O0993 Supervision of high risk pregnancy, unspecified, third trimester: Secondary | ICD-10-CM

## 2018-12-20 DIAGNOSIS — Z331 Pregnant state, incidental: Secondary | ICD-10-CM

## 2018-12-20 DIAGNOSIS — Z3689 Encounter for other specified antenatal screening: Secondary | ICD-10-CM

## 2018-12-20 DIAGNOSIS — O10919 Unspecified pre-existing hypertension complicating pregnancy, unspecified trimester: Secondary | ICD-10-CM | POA: Diagnosis not present

## 2018-12-20 DIAGNOSIS — O0990 Supervision of high risk pregnancy, unspecified, unspecified trimester: Secondary | ICD-10-CM

## 2018-12-20 DIAGNOSIS — R030 Elevated blood-pressure reading, without diagnosis of hypertension: Secondary | ICD-10-CM | POA: Diagnosis present

## 2018-12-20 LAB — COMPREHENSIVE METABOLIC PANEL
ALT: 11 U/L (ref 0–44)
AST: 14 U/L — ABNORMAL LOW (ref 15–41)
Albumin: 2.8 g/dL — ABNORMAL LOW (ref 3.5–5.0)
Alkaline Phosphatase: 146 U/L — ABNORMAL HIGH (ref 38–126)
Anion gap: 7 (ref 5–15)
BUN: 9 mg/dL (ref 6–20)
CO2: 17 mmol/L — ABNORMAL LOW (ref 22–32)
Calcium: 8.3 mg/dL — ABNORMAL LOW (ref 8.9–10.3)
Chloride: 108 mmol/L (ref 98–111)
Creatinine, Ser: 0.77 mg/dL (ref 0.44–1.00)
GFR calc Af Amer: >60 mL/min (ref >60)
GFR calc non Af Amer: >60 mL/min (ref >60)
Glucose, Bld: 79 mg/dL (ref 70–99)
Potassium: 3.8 mmol/L (ref 3.5–5.1)
Sodium: 132 mmol/L — ABNORMAL LOW (ref 135–145)
Total Bilirubin: 0.6 mg/dL (ref 0.3–1.2)
Total Protein: 5.7 g/dL — ABNORMAL LOW (ref 6.5–8.1)

## 2018-12-20 LAB — POCT URINALYSIS DIPSTICK OB
Blood, UA: NEGATIVE
Glucose, UA: NEGATIVE
Ketones, UA: NEGATIVE
Leukocytes, UA: NEGATIVE
Nitrite, UA: NEGATIVE

## 2018-12-20 LAB — URINALYSIS, ROUTINE W REFLEX MICROSCOPIC
Bilirubin Urine: NEGATIVE
Glucose, UA: NEGATIVE mg/dL
Hgb urine dipstick: NEGATIVE
Ketones, ur: NEGATIVE mg/dL
LEUKOCYTE UA: NEGATIVE
Nitrite: NEGATIVE
PROTEIN: NEGATIVE mg/dL
Specific Gravity, Urine: 1.015 (ref 1.005–1.030)
pH: 6.5 (ref 5.0–8.0)

## 2018-12-20 LAB — CBC
HCT: 33.2 % — ABNORMAL LOW (ref 36.0–46.0)
Hemoglobin: 10.4 g/dL — ABNORMAL LOW (ref 12.0–15.0)
MCH: 24.1 pg — ABNORMAL LOW (ref 26.0–34.0)
MCHC: 31.3 g/dL (ref 30.0–36.0)
MCV: 77 fL — ABNORMAL LOW (ref 80.0–100.0)
Platelets: 242 10*3/uL (ref 150–400)
RBC: 4.31 MIL/uL (ref 3.87–5.11)
RDW: 15.7 % — ABNORMAL HIGH (ref 11.5–15.5)
WBC: 11.3 10*3/uL — ABNORMAL HIGH (ref 4.0–10.5)
nRBC: 0 % (ref 0.0–0.2)

## 2018-12-20 LAB — PROTEIN / CREATININE RATIO, URINE
Creatinine, Urine: 141 mg/dL
Protein Creatinine Ratio: 0.18 mg/mg{Cre} — ABNORMAL HIGH (ref 0.00–0.15)
Total Protein, Urine: 25 mg/dL

## 2018-12-20 NOTE — Telephone Encounter (Signed)
Can you please call has some questions about dates of being out of work for disability  Claim # is [id number].  What date was she advised out of work?  Restrictions and limitations?

## 2018-12-20 NOTE — Progress Notes (Signed)
[place] 35+6 wks,cephalic,BPP 8/8,fundal placenta gr 3,afi 12 cm,fhr 123 bpm,RI .51,.53,.70,=60%

## 2018-12-20 NOTE — MAU Note (Signed)
Pt presents to MAU from OB office due to elevated BP and proteinuria. Here today for PIH eval. Pt denies VB and LOF. +FM

## 2018-12-20 NOTE — MAU Provider Note (Signed)
Chief Complaint  Patient presents with  . Hypertension     First Provider Initiated Contact with Patient 12/20/18 1220     S: Wynelle LinkKeyana Orellana  is a 23 y.o. y.o. year old G1P0 female at 6633w6d weeks gestation who presents to MAU with elevated blood pressures.  Hx of hypertension- diagnosed with gestational hypertension at 2487w2d. Current blood pressure medication: none  Associated symptoms: Denies Headache- reports having HA this morning rated 2/10, did not take any medication for HA and went away on its own, Denies vision changes, Denies epigastric pain Contractions: none Vaginal bleeding: none Fetal movement: present   O:  Patient Vitals for the past 24 hrs:  BP Temp Temp src Pulse Resp Height Weight  12/20/18 1316 133/82 - - 86 - - -  12/20/18 1300 140/79 - - - - - -  12/20/18 1246 132/79 - - 94 - - -  12/20/18 1231 (!) 147/83 - - 79 - - -  12/20/18 1215 135/85 - - 93 - - -  12/20/18 1159 138/80 - - 96 - - -  12/20/18 1156 138/68 - - 90 - - -  12/20/18 1152 - 98.3 F (36.8 C) Oral - 18 - -  12/20/18 1145 - - - - - 5\' 7"  (1.702 m) 125.6 kg   General: NAD Heart: Regular rate Lungs: Normal rate and effort Abd: Soft, NT, Gravid, S=D Extremities: no Pedal edema Neuro: 2+ deep tendon reflexes, No clonus Pelvic: NEFG, no bleeding or LOF.      FHR: 125/ moderate/ +accels/ no decelerations  Toco: no UC   Results for orders placed or performed during the hospital encounter of 12/20/18 (from the past 24 hour(s))  Urinalysis, Routine w reflex microscopic     Status: None   Collection Time: 12/20/18 12:04 PM  Result Value Ref Range   Color, Urine YELLOW YELLOW   APPearance CLEAR CLEAR   Specific Gravity, Urine 1.015 1.005 - 1.030   pH 6.5 5.0 - 8.0   Glucose, UA NEGATIVE NEGATIVE mg/dL   Hgb urine dipstick NEGATIVE NEGATIVE   Bilirubin Urine NEGATIVE NEGATIVE   Ketones, ur NEGATIVE NEGATIVE mg/dL   Protein, ur NEGATIVE NEGATIVE mg/dL   Nitrite NEGATIVE NEGATIVE   Leukocytes,Ua  NEGATIVE NEGATIVE  Protein / creatinine ratio, urine     Status: Abnormal   Collection Time: 12/20/18 12:04 PM  Result Value Ref Range   Creatinine, Urine 141.00 mg/dL   Total Protein, Urine 25 mg/dL   Protein Creatinine Ratio 0.18 (H) 0.00 - 0.15 mg/mg[Cre]  CBC     Status: Abnormal   Collection Time: 12/20/18 12:12 PM  Result Value Ref Range   WBC 11.3 (H) 4.0 - 10.5 K/uL   RBC 4.31 3.87 - 5.11 MIL/uL   Hemoglobin 10.4 (L) 12.0 - 15.0 g/dL   HCT 19.133.2 (L) 47.836.0 - 29.546.0 %   MCV 77.0 (L) 80.0 - 100.0 fL   MCH 24.1 (L) 26.0 - 34.0 pg   MCHC 31.3 30.0 - 36.0 g/dL   RDW 62.115.7 (H) 30.811.5 - 65.715.5 %   Platelets 242 150 - 400 K/uL   nRBC 0.0 0.0 - 0.2 %  Comprehensive metabolic panel     Status: Abnormal   Collection Time: 12/20/18 12:12 PM  Result Value Ref Range   Sodium 132 (L) 135 - 145 mmol/L   Potassium 3.8 3.5 - 5.1 mmol/L   Chloride 108 98 - 111 mmol/L   CO2 17 (L) 22 - 32 mmol/L   Glucose, Bld  79 70 - 99 mg/dL   BUN 9 6 - 20 mg/dL   Creatinine, Ser 1.610.77 0.44 - 1.00 mg/dL   Calcium 8.3 (L) 8.9 - 10.3 mg/dL   Total Protein 5.7 (L) 6.5 - 8.1 g/dL   Albumin 2.8 (L) 3.5 - 5.0 g/dL   AST 14 (L) 15 - 41 U/L   ALT 11 0 - 44 U/L   Alkaline Phosphatase 146 (H) 38 - 126 U/L   Total Bilirubin 0.6 0.3 - 1.2 mg/dL   GFR calc non Af Amer >60 >60 mL/min   GFR calc Af Amer >60 >60 mL/min   Anion gap 7 5 - 15    A: 4555w6d week IUP Gestational hypertension FHR reactive  P: Discharge home in stable condition per consult with preeclampsia precautions. Follow-up for prenatal appointment on 2/14 at doctor's office sooner as needed if symptoms worsen. Return to maternity admissions as needed in emergencies IOL scheduled for 2/20 @0700 - orders placed   Sharyon CableRogers, Ramzy Cappelletti C, CNM 12/20/2018 1:21 PM

## 2018-12-20 NOTE — Progress Notes (Signed)
   HIGH-RISK PREGNANCY VISIT Patient name: Meghan Burton MRN [id number]  Date of birth: [birth year] Chief Complaint:   High Risk Gestation (u/s today)  History of Present Illness:   Meghan Burton is a 23 y.o. G1P0 female at [redacted]w[redacted]d with an Estimated Date of Delivery: 01/18/19 being seen today for ongoing management of a high-risk pregnancy complicated by [facility] dx @ 33wks.  Today she reports headache yesterday, went away on its own. Currently denies headache/visual changes, ruq/epigastric pain. Contractions: Not present. Vag. Bleeding: None.  Movement: Present. denies leaking of fluid.  Review of Systems:   Pertinent items are noted in HPI Denies abnormal vaginal discharge w/ itching/odor/irritation, headaches, visual changes, shortness of breath, chest pain, abdominal pain, severe nausea/vomiting, or problems with urination or bowel movements unless otherwise stated above. Pertinent History Reviewed:  Reviewed past medical,surgical, social, obstetrical and family history.  Reviewed problem list, medications and allergies. Physical Assessment:   Vitals:   12/20/18 0906  BP: (!) 168/94  Pulse: (!) 103  Weight: 278 lb (126.1 kg)  Body mass index is 44.87 kg/m.           Physical Examination:   General appearance: alert, well appearing, and in no distress  Mental status: alert, oriented to person, place, and time  Skin: warm & dry   Extremities: Edema: Trace, DTRs 2+, no clonus   Cardiovascular: normal heart rate noted  Respiratory: normal respiratory effort, no distress  Abdomen: gravid, soft, non-tender  Pelvic: Cervical exam deferred         Fetal Status: Fetal Heart Rate (bpm): 123 u/s   Movement: Present Presentation: Vertex  Fetal Surveillance Testing today:  [place] 35+6 wks,cephalic,BPP 8/8,fundal placenta gr 3,afi 12 cm,fhr 123 bpm,RI .51,.53,.70,=60%  Results for orders placed or performed in visit on 12/20/18 (from the past 24 hour(s))  POC Urinalysis Dipstick OB   Collection  Time: 12/20/18  9:05 AM  Result Value Ref Range   Color, UA     Clarity, UA     Glucose, UA Negative Negative   Bilirubin, UA     Ketones, UA neg    Spec Grav, UA     Blood, UA neg    pH, UA     POC,PROTEIN,UA Small (1+) Negative, Trace, Small (1+), Moderate (2+), Large (3+), 4+   Urobilinogen, UA     Nitrite, UA neg    Leukocytes, UA Negative Negative   Appearance     Odor      Assessment & Plan:  1) High-risk pregnancy G1P0 at [redacted]w[redacted]d with an Estimated Date of Delivery: 01/18/19   2) GHTN>likely developing pre-e, severe range SBP today w/ proteinuria which she hasn't had. To MAU for further eval, notified Thressa Sheller, CNM  Meds: No orders of the defined types were placed in this encounter.  Labs/procedures today: bpp/dopp  Treatment Plan:  To MAU  Reviewed: Preterm labor symptoms and general obstetric precautions including but not limited to vaginal bleeding, contractions, leaking of fluid and fetal movement were reviewed in detail with the patient.  All questions were answered.  Follow-up: Return for As scheduled Fri for hrob/nst if d/c'd.  Orders Placed This Encounter  Procedures  . POC Urinalysis Dipstick OB   Cheral Marker CNM, [facility] 12/20/2018 10:12 AM

## 2018-12-21 ENCOUNTER — Encounter (HOSPITAL_COMMUNITY): Payer: Self-pay | Admitting: *Deleted

## 2018-12-21 ENCOUNTER — Telehealth (HOSPITAL_COMMUNITY): Payer: Self-pay | Admitting: *Deleted

## 2018-12-21 NOTE — Telephone Encounter (Signed)
Preadmission screen  

## 2018-12-23 ENCOUNTER — Encounter: Payer: Self-pay | Admitting: Obstetrics and Gynecology

## 2018-12-23 ENCOUNTER — Ambulatory Visit (INDEPENDENT_AMBULATORY_CARE_PROVIDER_SITE_OTHER): Payer: 59 | Admitting: Obstetrics and Gynecology

## 2018-12-23 VITALS — BP 148/84 | HR 92 | Wt 279.2 lb

## 2018-12-23 DIAGNOSIS — Z331 Pregnant state, incidental: Secondary | ICD-10-CM

## 2018-12-23 DIAGNOSIS — O133 Gestational [pregnancy-induced] hypertension without significant proteinuria, third trimester: Secondary | ICD-10-CM

## 2018-12-23 DIAGNOSIS — Z1389 Encounter for screening for other disorder: Secondary | ICD-10-CM

## 2018-12-23 DIAGNOSIS — O0993 Supervision of high risk pregnancy, unspecified, third trimester: Secondary | ICD-10-CM

## 2018-12-23 DIAGNOSIS — Z3A36 36 weeks gestation of pregnancy: Secondary | ICD-10-CM | POA: Diagnosis not present

## 2018-12-23 DIAGNOSIS — Z3483 Encounter for supervision of other normal pregnancy, third trimester: Secondary | ICD-10-CM

## 2018-12-23 DIAGNOSIS — Z7689 Persons encountering health services in other specified circumstances: Secondary | ICD-10-CM | POA: Diagnosis not present

## 2018-12-23 LAB — POCT URINALYSIS DIPSTICK OB
Blood, UA: NEGATIVE
GLUCOSE, UA: NEGATIVE
Ketones, UA: NEGATIVE
LEUKOCYTES UA: NEGATIVE
Nitrite, UA: NEGATIVE
POC,PROTEIN,UA: NEGATIVE

## 2018-12-23 LAB — OB RESULTS CONSOLE GC/CHLAMYDIA: Gonorrhea: NEGATIVE

## 2018-12-23 NOTE — Progress Notes (Signed)
Patient ID: Meghan Burton, female   DOB: 1996-03-05, 23 y.o.   MRN: [id number]    [facility] PREGNANCY VISIT Patient name: Meghan Burton MRN [id number]  Date of birth: [birth year] Chief Complaint:   No chief complaint on file.  History of Present Illness:   Meghan Burton is a 23 y.o. G1P0 female at [redacted]w[redacted]d with an Estimated Date of Delivery: 01/18/19 being seen today for ongoing management of a high-risk pregnancy complicated by gestational HTN dx @ 33 weeks per chart review.  Today she reports no complaints.  .  .   . denies leaking of fluid.  Review of Systems:   Pertinent items are noted in HPI Denies abnormal vaginal discharge w/ itching/odor/irritation, headaches, visual changes, shortness of breath, chest pain, abdominal pain, severe nausea/vomiting, or problems with urination or bowel movements unless otherwise stated above. Pertinent History Reviewed:  Reviewed past medical,surgical, social, obstetrical and family history.  Reviewed problem list, medications and allergies. Physical Assessment:  There were no vitals filed for this visit.There is no height or weight on file to calculate BMI.           Physical Examination:   General appearance: alert, well appearing, and in no distress  Mental status: alert, oriented to person, place, and time, normal mood, behavior, speech, dress, motor activity, and thought processes, affect appropriate to mood  Skin: warm & dry   Extremities:      Cardiovascular: normal heart rate noted  Respiratory: normal respiratory effort, no distress  Abdomen: gravid, soft, non-tender  Pelvic: Cervical exam performed closed, long, vertex -2  Reflexes: normal        Fetal Status:          Fetal Surveillance Testing today: NST  No results found for this or any previous visit (from the past 24 hour(s)).  Assessment & Plan:  1) High-risk pregnancy G1P0 at [redacted]w[redacted]d with an Estimated Date of Delivery: 01/18/19   2) GHTN, stable with plans for induction next week,  posted already by prior provider for next Thursday  Meds: No orders of the defined types were placed in this encounter.   Labs/procedures today: NST, GBS GCCHL  Treatment Plan:  NST 12/27/2018 IOL 12/29/2018 previously posted  Reviewed: Term labor symptoms and general obstetric precautions including but not limited to vaginal bleeding, contractions, leaking of fluid and fetal movement were reviewed in detail with the patient.  All questions were answered.  Follow-up: No follow-ups on file.  4 days NST  No orders of the defined types were placed in this encounter.  By signing my name below, I, Arnette Norris, attest that this documentation has been prepared under the direction and in the presence of Tilda Burrow, MD. Electronically Signed: Arnette Norris Medical Scribe. 12/23/18. 10:03 AM.  I personally performed the services described in this documentation, which was SCRIBED in my presence. The recorded information has been reviewed and considered accurate. It has been edited as necessary during review. Tilda Burrow, MD

## 2018-12-25 LAB — STREP GP B NAA: Strep Gp B NAA: POSITIVE — AB

## 2018-12-27 ENCOUNTER — Ambulatory Visit (INDEPENDENT_AMBULATORY_CARE_PROVIDER_SITE_OTHER): Payer: 59 | Admitting: Advanced Practice Midwife

## 2018-12-27 ENCOUNTER — Other Ambulatory Visit: Payer: Self-pay

## 2018-12-27 ENCOUNTER — Encounter: Payer: Self-pay | Admitting: Advanced Practice Midwife

## 2018-12-27 VITALS — BP 158/86 | HR 102 | Wt 282.0 lb

## 2018-12-27 DIAGNOSIS — O0993 Supervision of high risk pregnancy, unspecified, third trimester: Secondary | ICD-10-CM

## 2018-12-27 DIAGNOSIS — Z331 Pregnant state, incidental: Secondary | ICD-10-CM

## 2018-12-27 DIAGNOSIS — Z1389 Encounter for screening for other disorder: Secondary | ICD-10-CM

## 2018-12-27 LAB — GC/CHLAMYDIA PROBE AMP
CHLAMYDIA, DNA PROBE: NEGATIVE
Neisseria gonorrhoeae by PCR: NEGATIVE

## 2018-12-27 LAB — POCT URINALYSIS DIPSTICK OB
Blood, UA: NEGATIVE
GLUCOSE, UA: NEGATIVE
Ketones, UA: NEGATIVE
Leukocytes, UA: NEGATIVE
Nitrite, UA: NEGATIVE

## 2018-12-27 NOTE — Progress Notes (Signed)
HIGH-RISK PREGNANCY VISIT Patient name: Deloyce Lazaroff MRN [id number]  Date of birth: Apr 14, 1996 Chief Complaint:   High Risk Gestation (NST)  History of Present Illness:   Meghan Burton is a 23 y.o. G1P0 female at [redacted]w[redacted]d with an Estimated Date of Delivery: 01/18/19 being seen today for ongoing management of a high-risk pregnancy complicated by gestational HTN.  Today she reports no complaints. Contractions: Not present. Vag. Bleeding: None.  Movement: Present. denies leaking of fluid.  Review of Systems:   Pertinent items are noted in HPI Denies abnormal vaginal discharge w/ itching/odor/irritation, headaches, visual changes, shortness of breath, chest pain, abdominal pain, severe nausea/vomiting, or problems with urination or bowel movements unless otherwise stated above.    Pertinent History Reviewed:  Medical & Surgical Hx:   Past Medical History:  Diagnosis Date  . Allergy    seasonal  . Closed fracture of lateral portion of left tibial plateau 11/13/2014  . Pregnancy induced hypertension   . Tibial plateau fracture, left   . Vision abnormalities    glasses   Past Surgical History:  Procedure Laterality Date  . MYRINGOTOMY WITH TUBE PLACEMENT    . ORIF TIBIA PLATEAU Left 11/13/2014   Procedure: OPEN REDUCTION INTERNAL FIXATION (ORIF) LEFT LATERAL TIBIAL PLATEAU;  Surgeon: Eulas Post, MD;  Location: [facility];  Service: Orthopedics;  Laterality: Left;   Family History  Problem Relation Age of Onset  . Thyroid disease Maternal Grandmother   . Hypertension Maternal Grandmother   . Hypertension Mother     Current Outpatient Medications:  .  prenatal vitamin w/FE, FA (PRENATAL 1 + 1) 27-1 MG TABS tablet, Take 1 tablet by mouth daily at 12 noon., Disp: 30 each, Rfl: 12 Social History: Reviewed -  reports that she has never smoked. She has never used smokeless tobacco.   Physical Assessment:   Vitals:   12/27/18 1037  BP: (!) 168/88  Pulse: (!) 102  Weight:  282 lb (127.9 kg)  Body mass index is 44.17 kg/m.           Physical Examination:   General appearance: alert, well appearing, and in no distress  Mental status: alert, oriented to person, place, and time  Skin: warm & dry   Extremities: Edema: Trace    Cardiovascular: normal heart rate noted  Respiratory: normal respiratory effort, no distress  Abdomen: gravid, soft, non-tender  Pelvic: Cervical exam deferred         Fetal Status:     Movement: Present    Fetal Surveillance Testing today: NST: FHR baseline 145 bpm, Variability: moderate, Accelerations:present, Decelerations:  Absent= Cat 1/Reactive   Results for orders placed or performed in visit on 12/27/18 (from the past 24 hour(s))  POC Urinalysis Dipstick OB   Collection Time: 12/27/18 10:37 AM  Result Value Ref Range   Color, UA     Clarity, UA     Glucose, UA Negative Negative   Bilirubin, UA     Ketones, UA neg    Spec Grav, UA     Blood, UA neg    pH, UA     POC,PROTEIN,UA Trace Negative, Trace, Small (1+), Moderate (2+), Large (3+), 4+   Urobilinogen, UA     Nitrite, UA neg    Leukocytes, UA Negative Negative   Appearance     Odor      Assessment & Plan:  1) High-risk pregnancy G1P0 at [redacted]w[redacted]d with an Estimated Date of Delivery: 01/18/19   2) GTHN, unstable.  Treatment Plan:  IOL moved up to tonight at 2345;.  Dr Despina Hidden had previously scheduled for Thursday, so orders should be in    Follow-up:  No future appointments.  Orders Placed This Encounter  Procedures  . POC Urinalysis Dipstick OB   Jacklyn Shell CNM 12/27/2018 12:26 PM

## 2018-12-28 ENCOUNTER — Inpatient Hospital Stay (HOSPITAL_COMMUNITY): Payer: 59 | Admitting: Anesthesiology

## 2018-12-28 ENCOUNTER — Other Ambulatory Visit: Payer: Self-pay

## 2018-12-28 ENCOUNTER — Inpatient Hospital Stay (HOSPITAL_COMMUNITY)
Admission: AD | Admit: 2018-12-28 | Discharge: 2018-12-30 | DRG: 807 | Disposition: A | Discharge status: Home / Self Care | Payer: 59 | Attending: Family Medicine | Admitting: Family Medicine

## 2018-12-28 ENCOUNTER — Encounter (HOSPITAL_COMMUNITY): Payer: Self-pay | Admitting: *Deleted

## 2018-12-28 DIAGNOSIS — Z3A37 37 weeks gestation of pregnancy: Secondary | ICD-10-CM | POA: Diagnosis not present

## 2018-12-28 DIAGNOSIS — O99824 Streptococcus B carrier state complicating childbirth: Secondary | ICD-10-CM | POA: Diagnosis present

## 2018-12-28 DIAGNOSIS — O133 Gestational [pregnancy-induced] hypertension without significant proteinuria, third trimester: Secondary | ICD-10-CM | POA: Diagnosis present

## 2018-12-28 DIAGNOSIS — Z3A00 Weeks of gestation of pregnancy not specified: Secondary | ICD-10-CM | POA: Diagnosis not present

## 2018-12-28 DIAGNOSIS — O134 Gestational [pregnancy-induced] hypertension without significant proteinuria, complicating childbirth: Secondary | ICD-10-CM | POA: Diagnosis not present

## 2018-12-28 DIAGNOSIS — O0990 Supervision of high risk pregnancy, unspecified, unspecified trimester: Secondary | ICD-10-CM

## 2018-12-28 DIAGNOSIS — O99214 Obesity complicating childbirth: Secondary | ICD-10-CM | POA: Diagnosis present

## 2018-12-28 LAB — CBC
HCT: 34.4 % — ABNORMAL LOW (ref 36.0–46.0)
HCT: 31.9 % — ABNORMAL LOW (ref 36.0–46.0)
HEMATOCRIT: 32.2 % — AB (ref 36.0–46.0)
Hemoglobin: 10.9 g/dL — ABNORMAL LOW (ref 12.0–15.0)
Hemoglobin: 10 g/dL — ABNORMAL LOW (ref 12.0–15.0)
Hemoglobin: 10 g/dL — ABNORMAL LOW (ref 12.0–15.0)
MCH: 24 pg — ABNORMAL LOW (ref 26.0–34.0)
MCH: 23.9 pg — ABNORMAL LOW (ref 26.0–34.0)
MCH: 24.3 pg — ABNORMAL LOW (ref 26.0–34.0)
MCHC: 31.7 g/dL (ref 30.0–36.0)
MCHC: 31.1 g/dL (ref 30.0–36.0)
MCHC: 31.3 g/dL (ref 30.0–36.0)
MCV: 75.8 fL — ABNORMAL LOW (ref 80.0–100.0)
MCV: 77 fL — ABNORMAL LOW (ref 80.0–100.0)
MCV: 77.4 fL — ABNORMAL LOW (ref 80.0–100.0)
NRBC: 0 % (ref 0.0–0.2)
Platelets: 271 10*3/uL (ref 150–400)
Platelets: 229 10*3/uL (ref 150–400)
Platelets: 240 10*3/uL (ref 150–400)
RBC: 4.54 MIL/uL (ref 3.87–5.11)
RBC: 4.18 MIL/uL (ref 3.87–5.11)
RBC: 4.12 MIL/uL (ref 3.87–5.11)
RDW: 16.8 % — ABNORMAL HIGH (ref 11.5–15.5)
RDW: 16.3 % — ABNORMAL HIGH (ref 11.5–15.5)
RDW: 16.4 % — ABNORMAL HIGH (ref 11.5–15.5)
WBC: 15.3 10*3/uL — ABNORMAL HIGH (ref 4.0–10.5)
WBC: 16.5 10*3/uL — AB (ref 4.0–10.5)
WBC: 18.8 10*3/uL — ABNORMAL HIGH (ref 4.0–10.5)
nRBC: 0 % (ref 0.0–0.2)

## 2018-12-28 LAB — ABO/RH: ABO/RH(D): O POS

## 2018-12-28 LAB — TYPE AND SCREEN
ABO/RH(D): O POS
Antibody Screen: NEGATIVE

## 2018-12-28 LAB — RPR: RPR Ser Ql: NONREACTIVE

## 2018-12-28 MED ORDER — ONDANSETRON HCL 4 MG/2ML IJ SOLN: 4.0000 mg | INTRAMUSCULAR | Status: DC | PRN

## 2018-12-28 MED ORDER — OXYCODONE-ACETAMINOPHEN 5-325 MG PO TABS: 2.0000 | ORAL_TABLET | ORAL | Status: DC | PRN

## 2018-12-28 MED ORDER — LACTATED RINGERS IV SOLN
INTRAVENOUS | Status: DC
  Administered 2018-12-28 (×2): via INTRAVENOUS

## 2018-12-28 MED ORDER — WITCH HAZEL-GLYCERIN EX PADS: 1.0000 "application " | MEDICATED_PAD | CUTANEOUS | Status: DC | PRN

## 2018-12-28 MED ORDER — ACETAMINOPHEN 325 MG PO TABS: 650.0000 mg | ORAL_TABLET | ORAL | Status: DC | PRN

## 2018-12-28 MED ORDER — MISOPROSTOL 25 MCG QUARTER TABLET
25.0000 ug | ORAL_TABLET | ORAL | Status: DC | PRN
  Administered 2018-12-28: 25 ug via VAGINAL
  Filled 2018-12-28: qty 1

## 2018-12-28 MED ORDER — LACTATED RINGERS IV SOLN
500.0000 mL | INTRAVENOUS | Status: DC | PRN
  Administered 2018-12-28: 500 mL via INTRAVENOUS

## 2018-12-28 MED ORDER — FENTANYL CITRATE (PF) 100 MCG/2ML IJ SOLN
100.0000 ug | INTRAMUSCULAR | Status: DC | PRN
  Administered 2018-12-28: 100 ug via INTRAVENOUS
  Filled 2018-12-28: qty 2

## 2018-12-28 MED ORDER — OXYTOCIN 40 UNITS IN NORMAL SALINE INFUSION - SIMPLE MED
1.0000 m[IU]/min | INTRAVENOUS | Status: DC
  Administered 2018-12-28: 2 m[IU]/min via INTRAVENOUS
  Filled 2018-12-28 (×2): qty 1000

## 2018-12-28 MED ORDER — COCONUT OIL OIL: 1.0000 "application " | TOPICAL_OIL | Status: DC | PRN

## 2018-12-28 MED ORDER — PHENYLEPHRINE 40 MCG/ML (10ML) SYRINGE FOR IV PUSH (FOR BLOOD PRESSURE SUPPORT)
80.0000 ug | PREFILLED_SYRINGE | INTRAVENOUS | Status: DC | PRN
  Filled 2018-12-28 (×2): qty 10

## 2018-12-28 MED ORDER — DIPHENHYDRAMINE HCL 50 MG/ML IJ SOLN: 12.5000 mg | INTRAMUSCULAR | Status: DC | PRN

## 2018-12-28 MED ORDER — PRENATAL MULTIVITAMIN CH
1.0000 | ORAL_TABLET | Freq: Every day | ORAL | Status: DC
  Administered 2018-12-29 – 2018-12-30 (×2): 1 via ORAL
  Filled 2018-12-28 (×2): qty 1

## 2018-12-28 MED ORDER — FENTANYL 2.5 MCG/ML BUPIVACAINE 1/10 % EPIDURAL INFUSION (WH - ANES): INTRAMUSCULAR | Status: DC | PRN

## 2018-12-28 MED ORDER — TETANUS-DIPHTH-ACELL PERTUSSIS 5-2.5-18.5 LF-MCG/0.5 IM SUSP: 0.5000 mL | Freq: Once | INTRAMUSCULAR | Status: DC

## 2018-12-28 MED ORDER — PHENYLEPHRINE 40 MCG/ML (10ML) SYRINGE FOR IV PUSH (FOR BLOOD PRESSURE SUPPORT)
80.0000 ug | PREFILLED_SYRINGE | INTRAVENOUS | Status: DC | PRN
  Filled 2018-12-28: qty 10

## 2018-12-28 MED ORDER — EPHEDRINE 5 MG/ML INJ
10.0000 mg | INTRAVENOUS | Status: DC | PRN
  Filled 2018-12-28: qty 2

## 2018-12-28 MED ORDER — OXYTOCIN 40 UNITS IN NORMAL SALINE INFUSION - SIMPLE MED: 2.5000 [IU]/h | INTRAVENOUS | Status: DC

## 2018-12-28 MED ORDER — LACTATED RINGERS IV SOLN: 500.0000 mL | Freq: Once | INTRAVENOUS | Status: DC

## 2018-12-28 MED ORDER — ACETAMINOPHEN 325 MG PO TABS
650.0000 mg | ORAL_TABLET | ORAL | Status: DC | PRN
  Administered 2018-12-29: 650 mg via ORAL
  Filled 2018-12-28: qty 2

## 2018-12-28 MED ORDER — DIBUCAINE 1 % RE OINT: 1.0000 "application " | TOPICAL_OINTMENT | RECTAL | Status: DC | PRN

## 2018-12-28 MED ORDER — PENICILLIN G 3 MILLION UNITS IVPB - SIMPLE MED
3.0000 10*6.[IU] | INTRAVENOUS | Status: DC
  Administered 2018-12-28 (×3): 3 10*6.[IU] via INTRAVENOUS
  Filled 2018-12-28 (×4): qty 100

## 2018-12-28 MED ORDER — OXYCODONE-ACETAMINOPHEN 5-325 MG PO TABS: 1.0000 | ORAL_TABLET | ORAL | Status: DC | PRN

## 2018-12-28 MED ORDER — FENTANYL-BUPIVACAINE-NACL 0.5-0.125-0.9 MG/250ML-% EP SOLN
12.0000 mL/h | EPIDURAL | Status: DC | PRN
  Administered 2018-12-28: 12 mL/h via EPIDURAL
  Filled 2018-12-28: qty 250

## 2018-12-28 MED ORDER — ONDANSETRON HCL 4 MG PO TABS: 4.0000 mg | ORAL_TABLET | ORAL | Status: DC | PRN

## 2018-12-28 MED ORDER — OXYTOCIN 40 UNITS IN NORMAL SALINE INFUSION - SIMPLE MED: 1.0000 m[IU]/min | INTRAVENOUS | Status: DC

## 2018-12-28 MED ORDER — DIPHENHYDRAMINE HCL 25 MG PO CAPS: 25.0000 mg | ORAL_CAPSULE | Freq: Four times a day (QID) | ORAL | Status: DC | PRN

## 2018-12-28 MED ORDER — SODIUM CHLORIDE 0.9 % IV SOLN
5.0000 10*6.[IU] | Freq: Once | INTRAVENOUS | Status: AC
  Administered 2018-12-28: 5 10*6.[IU] via INTRAVENOUS
  Filled 2018-12-28: qty 5

## 2018-12-28 MED ORDER — BENZOCAINE-MENTHOL 20-0.5 % EX AERO
1.0000 "application " | INHALATION_SPRAY | CUTANEOUS | Status: DC | PRN
  Administered 2018-12-28: 1 via TOPICAL
  Filled 2018-12-28 (×2): qty 56

## 2018-12-28 MED ORDER — TERBUTALINE SULFATE 1 MG/ML IJ SOLN
0.2500 mg | Freq: Once | INTRAMUSCULAR | Status: AC | PRN
  Administered 2018-12-28: 0.25 mg via SUBCUTANEOUS
  Filled 2018-12-28: qty 1

## 2018-12-28 MED ORDER — LIDOCAINE HCL (PF) 1 % IJ SOLN
30.0000 mL | INTRAMUSCULAR | Status: DC | PRN
  Filled 2018-12-28: qty 30

## 2018-12-28 MED ORDER — MEASLES, MUMPS & RUBELLA VAC IJ SOLR
0.5000 mL | Freq: Once | INTRAMUSCULAR | Status: DC
  Filled 2018-12-28: qty 0.5

## 2018-12-28 MED ORDER — SOD CITRATE-CITRIC ACID 500-334 MG/5ML PO SOLN
30.0000 mL | ORAL | Status: DC | PRN
  Filled 2018-12-28: qty 30

## 2018-12-28 MED ORDER — TERBUTALINE SULFATE 1 MG/ML IJ SOLN
0.2500 mg | Freq: Once | INTRAMUSCULAR | Status: DC | PRN
  Filled 2018-12-28: qty 1

## 2018-12-28 MED ORDER — SIMETHICONE 80 MG PO CHEW: 80.0000 mg | CHEWABLE_TABLET | ORAL | Status: DC | PRN

## 2018-12-28 MED ORDER — ONDANSETRON HCL 4 MG/2ML IJ SOLN: 4.0000 mg | Freq: Four times a day (QID) | INTRAMUSCULAR | Status: DC | PRN

## 2018-12-28 MED ORDER — PHENYLEPHRINE 40 MCG/ML (10ML) SYRINGE FOR IV PUSH (FOR BLOOD PRESSURE SUPPORT)
PREFILLED_SYRINGE | INTRAVENOUS | Status: AC
  Filled 2018-12-28: qty 10

## 2018-12-28 MED ORDER — LIDOCAINE HCL (PF) 1 % IJ SOLN
INTRAMUSCULAR | Status: DC | PRN
  Administered 2018-12-28 (×2): 6 mL via EPIDURAL

## 2018-12-28 MED ORDER — SENNOSIDES-DOCUSATE SODIUM 8.6-50 MG PO TABS
2.0000 | ORAL_TABLET | ORAL | Status: DC
  Administered 2018-12-28 – 2018-12-29 (×2): 2 via ORAL
  Filled 2018-12-28 (×2): qty 2

## 2018-12-28 MED ORDER — IBUPROFEN 600 MG PO TABS
600.0000 mg | ORAL_TABLET | Freq: Four times a day (QID) | ORAL | Status: DC
  Administered 2018-12-28 – 2018-12-30 (×7): 600 mg via ORAL
  Filled 2018-12-28 (×7): qty 1

## 2018-12-28 MED ORDER — OXYTOCIN BOLUS FROM INFUSION: 500.0000 mL | Freq: Once | INTRAVENOUS | Status: DC

## 2018-12-28 NOTE — Discharge Summary (Signed)
Postpartum Discharge Summary     Patient Name: Meghan Burton DOB: [birth year] MRN: [id number]  Date of admission: 12/28/2018 Delivering Provider: Gwenevere Abbot   Date of discharge: 12/30/2018  Admitting diagnosis: INDUCTION Intrauterine pregnancy: [redacted]w[redacted]d     Secondary diagnosis:  Principal Problem:   Gestational hypertension, third trimester  Additional problems: none     Discharge diagnosis: Term Pregnancy Delivered and Gestational Hypertension                                                                                                Post partum procedures:none  Augmentation: Pitocin, Cytotec and Foley Balloon  Complications: None  Hospital course:  Induction of Labor With Vaginal Delivery   23 y.o. yo G1P1001 at [redacted]w[redacted]d was admitted to the hospital 12/28/2018 for induction of labor.  Indication for induction: Gestational hypertension.  Patient had an uncomplicated labor course as follows: Membrane Rupture Time/Date: 3:26 AM ,12/28/2018   Intrapartum Procedures: Episiotomy: None [1]                                         Lacerations:  2nd degree [3]  Patient had delivery of a Viable infant.  Information for the patient's newborn:  Tanmayi, Pendley [[id number]]  Delivery Method: Vag-Spont   12/28/2018  Details of delivery can be found in separate delivery note.  Patient had a routine postpartum course. Patient is discharged home 12/30/18.  Magnesium Sulfate recieved: No BMZ received: No  Physical exam  Vitals:   12/29/18 0853 12/29/18 1634 12/29/18 2201 12/30/18 0527  BP: 131/66 (!) 147/67 (!) 148/78 130/78  Pulse: 88 92 (!) 106 96  Resp: 18 18 18 18   Temp: 98.8 F (37.1 C) 99.1 F (37.3 C) 98.6 F (37 C) 98.2 F (36.8 C)  TempSrc: Oral Oral Oral Oral  SpO2:  100%    Weight:      Height:       General: alert, cooperative and no distress Lochia: appropriate Uterine Fundus: firm Incision: N/A DVT Evaluation: No evidence of DVT seen on physical  exam. Labs: Lab Results  Component Value Date   WBC 18.8 (H) 12/28/2018   HGB 10.0 (L) 12/28/2018   HCT 31.9 (L) 12/28/2018   MCV 77.4 (L) 12/28/2018   PLT 240 12/28/2018   CMP Latest Ref Rng & Units 12/20/2018  Glucose 70 - 99 mg/dL 79  BUN 6 - 20 mg/dL 9  Creatinine 0.86 - 5.78 mg/dL 4.69  Sodium 629 - 528 mmol/L 132(L)  Potassium 3.5 - 5.1 mmol/L 3.8  Chloride 98 - 111 mmol/L 108  CO2 22 - 32 mmol/L 17(L)  Calcium 8.9 - 10.3 mg/dL 8.3(L)  Total Protein 6.5 - 8.1 g/dL 4.1(L)  Total Bilirubin 0.3 - 1.2 mg/dL 0.6  Alkaline Phos 38 - 126 U/L 146(H)  AST 15 - 41 U/L 14(L)  ALT 0 - 44 U/L 11    Discharge instruction: per After Visit Summary and "Baby and Me Booklet".  After visit meds:  Allergies as  of 12/30/2018   No Known Allergies     Medication List    STOP taking these medications   prenatal vitamin w/FE, FA 27-1 MG Tabs tablet     TAKE these medications   enalapril 5 MG tablet Commonly known as:  VASOTEC Take 1 tablet (5 mg total) by mouth daily.   ibuprofen 600 MG tablet Commonly known as:  ADVIL,MOTRIN Take 1 tablet (600 mg total) by mouth every 6 (six) hours.       Diet: low salt diet  Activity: Advance as tolerated. Pelvic rest for 6 weeks.   Outpatient follow up:6 weeks Follow up Appt: Future Appointments  Date Time Provider Department Center  01/04/2019  9:30 AM [facility] NURSE TECH [facility]  01/31/2019 10:15 AM Cheral Marker, CNM [facility]   Follow up Visit: Follow-up Information    [facility] Follow up on 01/04/2019.   Why:  as scheduled for BP check Contact information: [address] [place] [address] [phone]         Please schedule this patient for Postpartum visit in: 6 weeks with the following provider: Any provider For C/S patients schedule nurse incision check in weeks 2 weeks: no High risk pregnancy complicated by: HTN Delivery mode:  SVD Anticipated Birth Control:   Nexplanon PP Procedures needed: BP check  Schedule Integrated BH visit: no  Newborn Data: Live born female  Birth Weight: 6 lb 7.2 oz (2926 g) APGAR: 9, 9  Newborn Delivery   Birth date/time:  12/28/2018 18:04:00 Delivery type:  Vaginal, Spontaneous     Baby Feeding: Both Disposition:home with mother   12/30/2018 Jacklyn Shell, CNM

## 2018-12-28 NOTE — Progress Notes (Signed)
Patient ID: Meghan Burton, female   DOB: 09-08-1996, 23 y.o.   MRN: [id number]  Patient seen. Having recurrent variable decelerations. Checked patient - foley balloon out.   Dilation: 4 Effacement (%): 50 Station: -3 Presentation: Vertex Exam by:: Dr. Adrian Blackwater  As already SROM, FSE and IUPC placed. Will start amnioinfusion. I discussed with patient that may end up in cesarean section if not tolerating labor.  Levie Heritage, DO 12/28/2018 8:12 AM

## 2018-12-28 NOTE — Anesthesia Preprocedure Evaluation (Signed)
Anesthesia Evaluation  Patient identified by MRN, date of birth, ID band Patient awake    Reviewed: Allergy & Precautions, H&P , NPO status , Patient's Chart, lab work & pertinent test results  Airway Mallampati: III  TM Distance: >3 FB Neck ROM: full    Dental no notable dental hx. (+) Teeth Intact   Pulmonary neg pulmonary ROS,    Pulmonary exam normal breath sounds clear to auscultation       Cardiovascular hypertension, Pt. on medications negative cardio ROS Normal cardiovascular exam Rhythm:regular Rate:Normal     Neuro/Psych negative neurological ROS  negative psych ROS   GI/Hepatic negative GI ROS, Neg liver ROS,   Endo/Other  Morbid obesity  Renal/GU negative Renal ROS  negative genitourinary   Musculoskeletal   Abdominal (+) + obese,   Peds  Hematology negative hematology ROS (+)   Anesthesia Other Findings   Reproductive/Obstetrics (+) Pregnancy                             Anesthesia Physical Anesthesia Plan  ASA: III  Anesthesia Plan: Epidural   Post-op Pain Management:    Induction:   PONV Risk Score and Plan:   Airway Management Planned:   Additional Equipment:   Intra-op Plan:   Post-operative Plan:   Informed Consent: I have reviewed the patients History and Physical, chart, labs and discussed the procedure including the risks, benefits and alternatives for the proposed anesthesia with the patient or authorized representative who has indicated his/her understanding and acceptance.       Plan Discussed with:   Anesthesia Plan Comments:         Anesthesia Quick Evaluation

## 2018-12-28 NOTE — Progress Notes (Signed)
Patient ID: Meghan Burton, female   DOB: Jun 16, 1996, 23 y.o.   MRN: [id number]  I have reviewed the overnight events and the fetal heart rate tracing from 0130 to present time. After dosing of terbutaline and initiation of amnioinfusion FHR has improved.   130/moderate/+accels and no decels (last was at 0812) No contractions present  Dilation: 4 Effacement (%): 50 Station: -3 Presentation: Vertex Exam by:: Dr. Adrian Blackwater   Plan:   Labor: Progressing, cervix not checked recently but no defer vaginal check currently due to ROM.  Plan to restart pitocin  EFM: Cat I currently, fetus is reactive and with moderate variability the risk of acidemia is incredibly low. Continuous fetal monitoring  Federico Flake, MD, MPH, ABFM Attending Physician Faculty Practice- [facility]

## 2018-12-28 NOTE — Progress Notes (Signed)
Dr. Alvester Morin at bedside and aware of variables and early vs. late decels. Position changes done. No new orders given.

## 2018-12-28 NOTE — Progress Notes (Signed)
Dr. Adrian Blackwater in department. Reported recurrent prolonged decels. Requested review of strip

## 2018-12-28 NOTE — Progress Notes (Signed)
Patient ID: Lailanee Arends, female   DOB: 13-Jun-1996, 23 y.o.   MRN: [id number]  Labor Progress Note Chelsi Shipley is a 23 y.o. G1P0 at [redacted]w[redacted]d presented for IOL for gHTN  S: Patient moved and pulled out FSE and IUPC   O:  BP 139/73   Pulse 71   Temp 98.2 F (36.8 C) (Oral)   Resp 16   Ht 5\' 7"  (1.702 m)   Wt 128.6 kg   LMP 04/13/2018   BMI 44.40 kg/m  EFM: 120/mod/+accels, no decels  CVE: Dilation: 6.5 Effacement (%): 80 Cervical Position: Anterior Station: -1 Presentation: Vertex Exam by:: Lyndel Safe   A&P: 23 y.o. G1P0 [redacted]w[redacted]d IOL for gHTN #Labor: Progressing well. Replaced IUPC and FSE. After placement patient started  Having recurrent variables with all contractions, changed patient positions including hands and knees for 10 minutes.  Put into exaggerated fowler's and infant tolerated well-- 120/mod/no accels, shallow variables vs early decels with return to baseline  #Pain: epidural #FWB: Cat I prior to replacement--> Cat II after #GBS positive  Federico Flake, MD 4:40 PM

## 2018-12-28 NOTE — Progress Notes (Signed)
Dr. Aneta Mins in room.  Discussed PO with patient and FOB.

## 2018-12-28 NOTE — Progress Notes (Signed)
OB/GYN Faculty Practice: Labor Progress Note  Subjective: Doing well. Into room because patient with recurrent decelerations. States feeling cramping more often but not much pain. Thought she broke her water about an hour ago, now with wet washcloth and a little blood on washcloth.   Objective: BP (!) 134/53   Pulse 75   Temp 98.2 F (36.8 C) (Oral)   Resp 14   Ht 5\' 7"  (1.702 m)   Wt 128.6 kg   LMP 04/13/2018   BMI 44.40 kg/m  Gen: well-appearing, NAD Dilation: 1.5 Effacement (%): 50 Station: -2 Presentation: Vertex Exam by:: Dr. Earlene Plater  Assessment and Plan: 23 y.o. G1P0 [redacted]w[redacted]d here for IOL for gHTN.  Labor: Induction started with vaginal cytotec. Recurrent decelerations, so will avoid additional doses of cytotec. Likely also related to SROM around 0300. Progressed from FT to 1-2cm, more effaced and more anterior cervix.  -- pain control: desires epidural -- PPH Risk: medium  Fetal Well-Being: EFW 2274g (41%) at 33w6. Cephalic by sutures.  -- Category II - continuous fetal monitoring - continue position changes, gave fluid bolus - still with excellent variability  -- GBS positive - PCN    GHTN: BP moderate range. Labs not repeated on admission, normal about a week ago. Asymptomatic at this time.  -- continue to monitor   [place] S. Earlene Plater, DO OB/GYN Fellow, Faculty Practice  4:25 AM

## 2018-12-28 NOTE — Anesthesia Pain Management Evaluation Note (Signed)
  CRNA Pain Management Visit Note  Patient: Meghan Burton, 23 y.o., female  "Hello I am a member of the anesthesia team at [facility]. We have an anesthesia team available at all times to provide care throughout the hospital, including epidural management and anesthesia for C-section. I don't know your plan for the delivery whether it a natural birth, water birth, IV sedation, nitrous supplementation, doula or epidural, but we want to meet your pain goals."   1.Was your pain managed to your expectations on prior hospitalizations?   No prior hospitalizations  2.What is your expectation for pain management during this hospitalization?     Epidural  3.How can we help you reach that goal? epidural  Record the patient's initial score and the patient's pain goal.   Pain: 5  Pain Goal: 5 [facility] wants you to be able to say your pain was always managed very well.  Meghan Burton 12/28/2018

## 2018-12-28 NOTE — Progress Notes (Signed)
Into room to evaluate for FB placement. Exam essentially unchanged, FB placed and inflated with 60cc of fluid. Category II strip but excellent variability and scal stim response, continuing position changes. Will defer starting pitocin for 2 hours.   Cristal Deer. Earlene Plater, DO OB/GYN Fellow

## 2018-12-28 NOTE — Progress Notes (Signed)
Called Dr. Alvester Morin to review FHR and decelerations.

## 2018-12-28 NOTE — H&P (Addendum)
LABOR AND DELIVERY ADMISSION HISTORY AND PHYSICAL NOTE  Meghan Burton is a 23 y.o. female G1P0 with IUP at [redacted]w[redacted]d by L/6 presenting for IOL due to gHTN. She reports positive fetal movement. She denies leakage of fluid or vaginal bleeding. She denies headache, vision changes, or RUQ pain.  Prenatal History/Complications: PNC at [facility] Pregnancy complications:  - gHTN  Past Medical History: Past Medical History:  Diagnosis Date  . Allergy    seasonal  . Closed fracture of lateral portion of left tibial plateau 11/13/2014  . Pregnancy induced hypertension   . Tibial plateau fracture, left   . Vision abnormalities    glasses    Past Surgical History: Past Surgical History:  Procedure Laterality Date  . MYRINGOTOMY WITH TUBE PLACEMENT    . ORIF TIBIA PLATEAU Left 11/13/2014   Procedure: OPEN REDUCTION INTERNAL FIXATION (ORIF) LEFT LATERAL TIBIAL PLATEAU;  Surgeon: Eulas Post, MD;  Location: [facility];  Service: Orthopedics;  Laterality: Left;    Obstetrical History: OB History    Gravida  1   Para      Term      Preterm      AB      Living        SAB      TAB      Ectopic      Multiple      Live Births              Social History: Social History   Socioeconomic History  . Marital status: Single    Spouse name: Niel Hummer  . Number of children: 0  . Years of education: 43  . Highest education level: 12th grade  Occupational History  . Not on file  Social Needs  . Financial resource strain: Somewhat hard  . Food insecurity:    Worry: Sometimes true    Inability: Sometimes true  . Transportation needs:    Medical: No    Non-medical: No  Tobacco Use  . Smoking status: Never Smoker  . Smokeless tobacco: Never Used  Substance and Sexual Activity  . Alcohol use: No  . Drug use: No  . Sexual activity: Yes    Birth control/protection: None  Lifestyle  . Physical activity:    Days per week: 0 days    Minutes per session: 0  min  . Stress: Not at all  Relationships  . Social connections:    Talks on phone: More than three times a week    Gets together: Once a week    Attends religious service: 1 to 4 times per year    Active member of club or organization: No    Attends meetings of clubs or organizations: Never    Relationship status: Never married  Other Topics Concern  . Not on file  Social History Narrative  . Not on file    Family History: Family History  Problem Relation Age of Onset  . Thyroid disease Maternal Grandmother   . Hypertension Maternal Grandmother   . Hypertension Mother     Allergies: No Known Allergies  Medications Prior to Admission  Medication Sig Dispense Refill Last Dose  . prenatal vitamin w/FE, FA (PRENATAL 1 + 1) 27-1 MG TABS tablet Take 1 tablet by mouth daily at 12 noon. 30 each 12 Taking     Review of Systems  All systems reviewed and negative except as stated in HPI Review of Systems  Constitutional: Negative for diaphoresis and fever.  HENT: Negative for congestion, sinus pain and sore throat.   Eyes: Negative for blurred vision and double vision.  Respiratory: Negative for cough, shortness of breath and wheezing.   Cardiovascular: Negative for chest pain and palpitations.  Gastrointestinal: Negative for abdominal pain, heartburn, nausea and vomiting.  Genitourinary: Negative for dysuria, frequency and urgency.  Skin: Negative for itching and rash.  Neurological: Negative for dizziness and headaches.   Physical Exam Blood pressure (!) 161/85, pulse 77, temperature 98.3 F (36.8 C), temperature source Oral, resp. rate 16, height 5\' 7"  (1.702 m), weight 128.6 kg, last menstrual period 04/13/2018. General appearance: alert, cooperative and no distress Lungs: normal work of breathing Abdomen: gravid Extremities: No calf swelling or tenderness Presentation: cephalic at last check on 12/23/2018 Fetal monitoring: 130s/mod/+a/-d Uterine activity: irregular  contractions Dilation: 1.5 Effacement (%): 50 Station: -2 Exam by:: Dr. Earlene PlaterWallace  Prenatal labs: ABO, Rh: --/--/O POS (02/19 0045) Antibody: NEG (02/19 0045) Rubella: 1.85 (08/27 1509) RPR: Non Reactive (12/13 0911)  HBsAg: Negative (08/27 1509)  HIV: Non Reactive (12/13 0911)  GC/Chlamydia: negative GBS: Positive (02/14 1200)  1 hr Glucola: 96 Genetic screening:  declined Anatomy US: normal  Prenatal Transfer Tool  Maternal Diabetes: No Genetic Screening: Declined Maternal Ultrasounds/Referrals: Normal Fetal Ultrasounds or other Referrals:  None Maternal Substance Abuse:  No Significant Maternal Medications:  None Significant Maternal Lab Results: None  Results for orders placed or performed during the hospital encounter of 12/28/18 (from the past 24 hour(s))  Type and screen [facility]   Collection Time: 12/28/18 12:45 AM  Result Value Ref Range   ABO/RH(D) O POS    Antibody Screen NEG    Sample Expiration      12/31/2018 Performed at [facility], [address] [place], [place] [id number]   CBC   Collection Time: 12/28/18 12:47 AM  Result Value Ref Range   WBC 15.3 (H) 4.0 - 10.5 K/uL   RBC 4.54 3.87 - 5.11 MIL/uL   Hemoglobin 10.9 (L) 12.0 - 15.0 g/dL   HCT 09.834.4 (L) 11.936.0 - 14.746.0 %   MCV 75.8 (L) 80.0 - 100.0 fL   MCH 24.0 (L) 26.0 - 34.0 pg   MCHC 31.7 30.0 - 36.0 g/dL   RDW 82.916.8 (H) 56.211.5 - 13.015.5 %   Platelets 271 150 - 400 K/uL  Results for orders placed or performed in visit on 12/27/18 (from the past 24 hour(s))  POC Urinalysis Dipstick OB   Collection Time: 12/27/18 10:37 AM  Result Value Ref Range   Color, UA     Clarity, UA     Glucose, UA Negative Negative   Bilirubin, UA     Ketones, UA neg    Spec Grav, UA     Blood, UA neg    pH, UA     POC,PROTEIN,UA Trace Negative, Trace, Small (1+), Moderate (2+), Large (3+), 4+   Urobilinogen, UA     Nitrite, UA neg    Leukocytes, UA Negative Negative   Appearance     Odor       Patient Active Problem List   Diagnosis Date Noted  . Gestational hypertension, third trimester 12/28/2018  . Gestational hypertension 12/02/2018  . Supervision of high risk pregnancy, antepartum 07/05/2018  . Closed fracture of lateral portion of left tibial plateau 11/13/2014  . Fracture, tibial plateau 11/13/2014    Assessment: Meghan Burton is a 23 y.o. G1P0 at 9161w0d here for IOL due to gHTN.  #Labor: Induction to start  with vaginal cytotec. Will plan to place FB when able.  #Pain: Anethesia/anlagesic prn; epidural on request #FWB:  Cat 1 #ID: GBS +, administer PCN #MOF: Breast and bottle #MOC: Nexplanon #Circ: Boy, outpatient circumcision at [facility]  Arlyce Harman, DO, PGY2  Attestation: I have seen this patient and agree with the resident's documentation. I have examined them separately, and we have discussed the plan of care. Recent UPC in clinic 0.18, HELLP labs wnl 12/20/18.   Cristal Deer. Earlene Plater, DO OB/GYN Fellow

## 2018-12-28 NOTE — Progress Notes (Signed)
Patient ID: Meghan Burton, female   DOB: 1995/12/13, 23 y.o.   MRN: [id number]  Called by RN for variable HR decelerations with start of pitocin.   Fetal monitoring reviewed   120/mod/+accels,  Variable deceleration with nadir at 60 with quick return to baseline, occurring currently with some contractions.  Contractions are currently q 10-15 minutes  Assessment: Overall I am reassured by the tracing on this infant. Given the moderate variability and quick return to baseline the current risk of acidemia or metabolic disturbance is low.   Plan:  Labor: continue pitocin, increase per protocol.  EFM: Cat II, reassuring. Continue current intervention of amnioinfusion.  GBS: positive, on PCN Expected mode of delivery- vaginal

## 2018-12-28 NOTE — Anesthesia Procedure Notes (Signed)
Epidural Patient location during procedure: OB Start time: 12/28/2018 12:10 PM End time: 12/28/2018 12:17 PM  Staffing Anesthesiologist: Leilani Able, MD  Preanesthetic Checklist Completed: patient identified, site marked, surgical consent, pre-op evaluation, timeout performed, IV checked, risks and benefits discussed and monitors and equipment checked  Epidural Patient position: sitting Prep: site prepped and draped and DuraPrep Patient monitoring: continuous pulse ox and blood pressure Approach: midline Location: L3-L4 Injection technique: LOR air  Needle:  Needle type: Tuohy  Needle gauge: 17 G Needle length: 9 cm and 9 Needle insertion depth: 8 cm Catheter type: closed end flexible Catheter size: 19 Gauge Catheter at skin depth: 14 cm Test dose: negative and Other  Assessment Sensory level: T9 Events: blood not aspirated, injection not painful, no injection resistance, negative IV test and no paresthesia  Additional Notes Reason for block:procedure for pain

## 2018-12-28 NOTE — Progress Notes (Signed)
LABOR PROGRESS NOTE  Meghan Burton is a 23 y.o. G1P0 at [redacted]w[redacted]d admitted for gHTN  Subjective: Feeling comfortable overall Some pain with contractions Ready for epidural  Objective: BP (!) 143/77   Pulse (!) 107   Temp 98.3 F (36.8 C) (Oral)   Resp 16   Ht 5\' 7"  (1.702 m)   Wt 128.6 kg   LMP 04/13/2018   BMI 44.40 kg/m  or  Vitals:   12/28/18 1002 12/28/18 1045 12/28/18 1100 12/28/18 1115  BP: 124/67 (!) 143/69 (!) 146/75 (!) 143/77  Pulse: 99 (!) 114 (!) 110 (!) 107  Resp: 16 16 16 16   Temp:      TempSrc:      Weight:      Height:        Dilation: 4 Effacement (%): 50 Station: -3 Presentation: Vertex Exam by:: Dr. Adrian Blackwater FHT: baseline rate 140, moderate varibility, + acel, variable decel Toco: q6-11m  Labs: Lab Results  Component Value Date   WBC 15.3 (H) 12/28/2018   HGB 10.9 (L) 12/28/2018   HCT 34.4 (L) 12/28/2018   MCV 75.8 (L) 12/28/2018   PLT 271 12/28/2018    Patient Active Problem List   Diagnosis Date Noted  . Gestational hypertension, third trimester 12/28/2018  . Gestational hypertension 12/02/2018  . Supervision of high risk pregnancy, antepartum 07/05/2018  . Closed fracture of lateral portion of left tibial plateau 11/13/2014  . Fracture, tibial plateau 11/13/2014    Assessment / Plan: 23 y.o. G1P0 at [redacted]w[redacted]d here for IOL for gHTN  Labor: Patient had some prolonged variables early this morning after SROM. Discussed possibility for pLTCS in detail with patient and FOB. Will restart pitocin following epidural placement and keep a close eye Fetal Wellbeing:  Cat 2 (reassuring) Pain Control:  Desires epidural. Repeat CBC ordered for placement after results.  Anticipated MOD:  SVD. Discussed pLTCS if baby does not tolerate.   Gwenevere Abbot, MD  OB Fellow  12/28/2018, 11:35 AM

## 2018-12-29 ENCOUNTER — Inpatient Hospital Stay (HOSPITAL_COMMUNITY): Admit: 2018-12-29 | Payer: 59

## 2018-12-29 NOTE — Progress Notes (Signed)
Post Partum Day 1 Subjective: Patient reports that she is doing well.  She has no complaints this morning.  Pain is well controlled.  Patient is tolerating PO and has + flatus.  She has not yet had a bowel movement following delivery.  She is ambulating well, and denies any shortness or breath or chest pain.  She also denies headaches, dizziness or visual changes including floaters.  She plans to use Nexplanon for contraception.   Objective: Blood pressure 122/65, pulse 92, temperature 98.8 F (37.1 C), temperature source Oral, resp. rate 18, height 5\' 7"  (1.702 m), weight 128.6 kg, last menstrual period 04/13/2018, unknown if currently breastfeeding.  Physical Exam:  General: alert, cooperative and appears stated age Lochia: appropriate Uterine Fundus: firm Pulmonary: lungs clear to auscultation bilaterally Cardiac: Normal cardiac rate and rhythm, no murmurs, rubs or gallops. DVT Evaluation: Negative Homan's sign. No cords or calf tenderness. Calf/Ankle edema is present 1+ edema bilaterally.    Recent Labs    12/28/18 1137 12/28/18 1945  HGB 10.0* 10.0*  HCT 32.2* 31.9*    Assessment/Plan: A: Post partum day 1  P: Lactation consult Patient plans to use Nexplanon as her contraceptive method Discharge tomorrow   LOS: 1 day   Francene Boyers 12/29/2018, 7:36 AM

## 2018-12-29 NOTE — Lactation Note (Signed)
This note was copied from a baby's chart. Lactation Consultation Note  Patient Name: Meghan Burton [id number] Date: 12/29/2018 Reason for consult: Initial assessment;Early term 37-38.6wks;1st time breastfeeding P1, 7 hour female infant, ETI, Mom with GHTN in pregnancy. Per parents, infant had one stool and 2 voids since delivery.. Per mom, she did not attend any BF classes in her pregnancy. Per mom she receives [facility] in [place]. Has DEBP at home. Per mom, infant latches well on left breast but not latch as well on the right breast. Infant was cuing to breastfeed  as LC entered the room.  LC notice mom's breast are semi-flat and short shafted. LC discussed mom pre-pumping, doing breast stimulation and wear breast shells to help elongate nipple shaft out more. Mom knows not to sleep in breast shells and to wear them during the day. Mom expressed small amount colostrum and did breast stimulation prior to latching infant on right breast using the football hold. Infant latched well with mouth wide, lower jaw extended and infant breastfeed for 12 minutes and was still breastfeeding as LC left the room. Mom is feeling more confident with breastfeeding infant. Mom knows to breastfeed according hunger cues and BF 8 or more times within 24 hours. LC discussed I & O. Reviewed Baby & Me book's Breastfeeding Basics.  Mom made aware of O/P services, breastfeeding support groups, community resources, and our phone # for post-discharge questions.  Maternal Data Formula Feeding for Exclusion: No Has patient been taught Hand Expression?: Yes(Mom taught back hand expression colostrum present both breast.) Does the patient have breastfeeding experience prior to this delivery?: No  Feeding Feeding Type: Breast Fed  LATCH Score Latch: Grasps breast easily, tongue down, lips flanged, rhythmical sucking.  Audible Swallowing: Spontaneous and intermittent  Type of Nipple: Flat  Comfort  (Breast/Nipple): Soft / non-tender  Hold (Positioning): Assistance needed to correctly position infant at breast and maintain latch.  LATCH Score: 8  Interventions Interventions: Breast feeding basics reviewed;Skin to skin;Breast massage;Hand express;Support pillows;Adjust position;Breast compression;Position options;Expressed milk;Shells;Hand pump  Lactation Tools Discussed/Used Tools: Shells;Pump Breast pump type: Manual WIC Program: Yes Pump Review: Setup, frequency, and cleaning;Milk Storage Initiated by:: Danelle Earthly, IBCLC Date initiated:: 12/29/18   Consult Status Consult Status: Follow-up Date: 12/29/18 Follow-up type: In-patient    Danelle Earthly 12/29/2018, 1:44 AM

## 2018-12-29 NOTE — Anesthesia Postprocedure Evaluation (Signed)
Anesthesia Post Note  Patient: Meghan Burton  Procedure(s) Performed: AN AD HOC LABOR EPIDURAL     Patient location during evaluation: Mother Baby Anesthesia Type: Epidural Level of consciousness: awake, awake and alert and oriented Pain management: pain level controlled Vital Signs Assessment: post-procedure vital signs reviewed and stable Respiratory status: spontaneous breathing Cardiovascular status: blood pressure returned to baseline Postop Assessment: no headache, no backache, epidural receding, patient able to bend at knees, no apparent nausea or vomiting, adequate PO intake and able to ambulate Anesthetic complications: no    Last Vitals:  Vitals:   12/29/18 0140 12/29/18 0531  BP: 130/73 122/65  Pulse: 88 92  Resp: 18 18  Temp: 37 C 37.1 C    Last Pain:  Vitals:   12/29/18 0531  TempSrc: Oral  PainSc:    Pain Goal: Patients Stated Pain Goal: 5 (12/28/18 0535)                 Cleda Clarks

## 2018-12-29 NOTE — Progress Notes (Signed)
Fentanyl epidural wasted 150.5ml

## 2018-12-30 MED ORDER — IBUPROFEN 600 MG PO TABS: 600.0000 mg | ORAL_TABLET | Freq: Four times a day (QID) | ORAL | 0 refills | Status: DC

## 2018-12-30 MED ORDER — ENALAPRIL MALEATE 5 MG PO TABS: 5.0000 mg | ORAL_TABLET | Freq: Every day | ORAL | 2 refills | Status: DC

## 2018-12-30 MED ORDER — ENALAPRIL MALEATE 5 MG PO TABS: 5.0000 mg | ORAL_TABLET | Freq: Two times a day (BID) | ORAL | Status: DC

## 2018-12-30 MED ORDER — ENALAPRIL MALEATE 5 MG PO TABS
5.0000 mg | ORAL_TABLET | Freq: Every day | ORAL | Status: DC
  Administered 2018-12-30: 5 mg via ORAL
  Filled 2018-12-30 (×2): qty 1

## 2018-12-30 NOTE — Lactation Note (Signed)
This note was copied from a baby's chart. Lactation Consultation Note  Patient Name: Meghan Burton [id number] Date: 12/30/2018 Reason for consult: Follow-up assessment;1st time breastfeeding;Early term 37-38.6wks P1, 34 hour female infant. Per mom, she was giving  Infant more formula yesterday because she thought she did not have enough breast milk for her  baby.  Mom easily expressed colostrum. Mom latched infant to left breast using football hold, infant easily latched and swallows heard. Infant BF for 18 minutes. Per mom, she is beginning to feel more confident with breastfeeding and plans to start breastfeeding more. Per mom, she did pump a few times yesterday LC reviewed local support services, Breastfeeding support group, WIC breastfeeding Peer Counselor, [employer] and [facility] Breastfeeding support group.  Mom will breastfeed first then supplement with EBM or formula afterwards. Dad give infant 7 ml of formula according supplemental guidelines. Mom plans to pump after breastfeeding infant this morning.  Mom will try pump every 3 hours. Mom knows to call Nurse or LC if she has any questions or needs assistance with latching infant to breast.   Maternal Data    Feeding    LATCH Score Latch: Grasps breast easily, tongue down, lips flanged, rhythmical sucking.  Audible Swallowing: Spontaneous and intermittent  Type of Nipple: Everted at rest and after stimulation  Comfort (Breast/Nipple): Soft / non-tender  Hold (Positioning): Assistance needed to correctly position infant at breast and maintain latch.  LATCH Score: 9  Interventions Interventions: Assisted with latch;DEBP;Adjust position;Support pillows;Breast massage  Lactation Tools Discussed/Used     Consult Status Consult Status: Follow-up Date: 12/30/18 Follow-up type: In-patient    Danelle Earthly 12/30/2018, 4:30 AM

## 2018-12-30 NOTE — Lactation Note (Signed)
This note was copied from a baby's chart. Lactation Consultation Note  Patient Name: Meghan Burton [id number] Date: 12/30/2018 Reason for consult: Follow-up assessment;Early term 37-38.6wks;Primapara;1st time breastfeeding  P1 mother whose infant is now 85 hours old.  Mother was attempting to latch baby when I arrived.  Offered to assist with latch and mother accepted.  Mother's breasts are soft and non tender and nipples are short shafted and intact.  Mother has breast shells at bedside but is not routinely using them.  Attempted to latch baby but he was showing no feeding cues; sleepy at breast.  Reviewed feeding cues and how to determine if baby is ready to feed.  Mother verbalized understanding.  Engorgement prevention/treatment discussed.  Mother stated her breasts are not feeling any heavier yet.  She will continue feeding 8-12 times/24 hours or sooner if baby shows cues.  Mother is familiar with hand expression and is able to hand express drops of colostrum.  She stated that she just needs to keep practicing.  Reassured mother and praised her efforts.  Reminded her to call our OP number if she would need a return visit or if she has any questions/concerns after discharge.  She will be a "stay at home" mother and has a DEBP for home use.  Manual pump at bedside and mother familiar with its use.  Father present.   Maternal Data Formula Feeding for Exclusion: No Has patient been taught Hand Expression?: Yes Does the patient have breastfeeding experience prior to this delivery?: No  Feeding Feeding Type: Breast Fed  LATCH Score Latch: Too sleepy or reluctant, no latch achieved, no sucking elicited.(Baby not ready to feed)  Audible Swallowing: None  Type of Nipple: Everted at rest and after stimulation(short shafted)  Comfort (Breast/Nipple): Soft / non-tender  Hold (Positioning): Assistance needed to correctly position infant at breast and maintain latch.  LATCH Score:  5  Interventions    Lactation Tools Discussed/Used Tools: Shells;Pump Shell Type: Inverted Breast pump type: Double-Electric Breast Pump;Manual Pump Review: Setup, frequency, and cleaning(reviewed)   Consult Status Consult Status: Complete Date: 12/30/18 Follow-up type: Call as needed    Zharia Conrow R Mickeal Daws 12/30/2018, 9:06 AM

## 2018-12-30 NOTE — Discharge Instructions (Signed)

## 2019-01-04 ENCOUNTER — Other Ambulatory Visit: Payer: Self-pay | Admitting: Adult Health

## 2019-01-04 ENCOUNTER — Ambulatory Visit: Payer: 59 | Admitting: *Deleted

## 2019-01-04 ENCOUNTER — Encounter: Payer: Self-pay | Admitting: *Deleted

## 2019-01-04 VITALS — BP 148/84 | HR 103

## 2019-01-04 DIAGNOSIS — Z0130 Encounter for examination of blood pressure without abnormal findings: Secondary | ICD-10-CM

## 2019-01-04 MED ORDER — HYDROCHLOROTHIAZIDE 12.5 MG PO CAPS: 12.5000 mg | ORAL_CAPSULE | Freq: Every day | ORAL | 0 refills | Status: DC

## 2019-01-04 NOTE — Progress Notes (Signed)
Pt in for BP check, BP still mildly elevated on Vasotec, has some swelling, will add 12.5 mg Microzide  And recheck BP in 1 week

## 2019-01-04 NOTE — Progress Notes (Signed)
Pt here for BP check. Provider informed of BP 148/84. Orders received.  Advised to return in 1 week for BP check

## 2019-01-13 ENCOUNTER — Ambulatory Visit (INDEPENDENT_AMBULATORY_CARE_PROVIDER_SITE_OTHER): Payer: 59

## 2019-01-13 VITALS — BP 127/78 | HR 93 | Ht 67.0 in | Wt 258.0 lb

## 2019-01-13 DIAGNOSIS — Z0130 Encounter for examination of blood pressure without abnormal findings: Secondary | ICD-10-CM

## 2019-01-13 NOTE — Progress Notes (Signed)
Pt here for Bp check 127/78 pulse 93. Spoke kim booker about reading. Advise continue take Vasotec 5 mg and Hydrochlorothiazide 12.5 mg. Stop taking Hydrochlorothiazide two days before pp visit. Pad CMA

## 2019-01-30 ENCOUNTER — Telehealth: Payer: Self-pay | Admitting: *Deleted

## 2019-01-30 NOTE — Telephone Encounter (Signed)
Called patient and left detailed message about no visitors at appointment and no children. Also advised that if she has had any exposure to COVID-19 to cancel appt.

## 2019-01-31 ENCOUNTER — Ambulatory Visit: Payer: Self-pay | Admitting: Women's Health

## 2019-02-21 ENCOUNTER — Encounter: Payer: Self-pay | Admitting: Women's Health

## 2019-02-21 ENCOUNTER — Other Ambulatory Visit: Payer: Self-pay

## 2019-02-21 ENCOUNTER — Ambulatory Visit (INDEPENDENT_AMBULATORY_CARE_PROVIDER_SITE_OTHER): Payer: 59 | Admitting: Women's Health

## 2019-02-21 DIAGNOSIS — Z1389 Encounter for screening for other disorder: Secondary | ICD-10-CM

## 2019-02-21 DIAGNOSIS — N898 Other specified noninflammatory disorders of vagina: Secondary | ICD-10-CM

## 2019-02-21 NOTE — Progress Notes (Signed)
TELEHEALTH VIRTUAL POSTPARTUM VISIT ENCOUNTER NOTE  I connected with@ on 02/21/19 at 10:00 AM EDT [facility] and verified that I am speaking with the correct person using two identifiers.   I discussed the limitations, risks, security and privacy concerns of performing an evaluation and management service by telephone and the availability of in person appointments. I also discussed with the patient that there may be a patient responsible charge related to this service. The patient expressed understanding and agreed to proceed.  Appointment Date: 02/21/2019  OBGYN Clinic: [facility]  Chief Complaint:  Chief Complaint  Patient presents with  . postpartum visit    leaking fluid, not blood; interested in Nexplanon    History of Present Illness: Meghan Burton is a 23 y.o. African-American G1P1001 (No LMP recorded.), seen for the above chief complaint. Her past medical history is significant for nothing.   She is s/p normal spontaneous vaginal delivery on 12/28/18 at 37.0 weeks [facility]; she was discharged to home on PPD#2. D/C'd on vasotec 5mg , added hctz 12.5mg  on 2/26 bp check visit. Stopped both 1wk ago. [facility]. Baby is doing well.  Complains of nothing  Vaginal bleeding or discharge: no bleeding, does have d/c w/ odor, no itching/irritation Mode of feeding infant: Breast Intercourse: No  Contraception: wants Nexplanon PP depression s/s: No .  Any bowel or bladder issues: No  Pap smear: no abnormalities (date: 07/05/18)  Review of Systems: Positive for discharge. Her 12 point review of systems is negative or as noted in the History of Present Illness.  Patient Active Problem List   Diagnosis Date Noted  . History of gestational hypertension 12/02/2018  . Supervision of high risk pregnancy, antepartum 07/05/2018  . Closed fracture of lateral portion of left tibial plateau 11/13/2014  . Fracture, tibial plateau 11/13/2014     Medications Meghan Burton had no medications administered during this visit. Current Outpatient Medications  Medication Sig Dispense Refill  . enalapril (VASOTEC) 5 MG tablet Take 1 tablet (5 mg total) by mouth daily. 30 tablet 2  . hydrochlorothiazide (MICROZIDE) 12.5 MG capsule Take 1 capsule (12.5 mg total) by mouth daily. 30 capsule 0   No current facility-administered medications for this visit.     Allergies Patient has no known allergies.  Physical Exam:  General:  Alert, oriented and cooperative.   Mental Status: Normal mood and affect perceived. Normal judgment and thought content.  Rest of physical exam deferred due to type of encounter BP 130/88 (BP Location: Left Wrist, Patient Position: Sitting)   Pulse 76   Ht 5\' 7"  (1.702 m)   Wt 250 lb (113.4 kg)   Breastfeeding Yes   BMI 39.16 kg/m   PP Depression Screening:   Edinburgh Postnatal Depression Scale - 02/21/19 0950      Edinburgh Postnatal Depression Scale:  In the Past 7 Days   I have been able to laugh and see the funny side of things.  0    I have looked forward with enjoyment to things.  0    I have blamed myself unnecessarily when things went wrong.  0    I have been anxious or worried for no good reason.  0    I have felt scared or panicky for no good reason.  0    Things have been getting on top of me.  0    I have been so unhappy that I have had difficulty sleeping.  0    I have felt sad or miserable.  0    I have been so unhappy that I have been crying.  0    The thought of harming myself has occurred to me.  0    Edinburgh Postnatal Depression Scale Total  0       Assessment:Patient is a 23 y.o. G1P1001 who is 7 weeks postpartum from a normal spontaneous vaginal delivery after IOL for GHTN.  She is doing well. Off bp meds.   Plan: Check bp daily, bring log to appt RTC for nexplanon insertion and wet prep/nuswab for discharge, abstinence until after nexplanon  I discussed the assessment and  treatment plan with the patient. The patient was provided an opportunity to ask questions and all were answered. The patient agreed with the plan and demonstrated an understanding of the instructions.   The patient was advised to call back or seek an in-person evaluation/go to the ED for any concerning postpartum symptoms.  I provided 8 minutes of non-face-to-face time during this encounter.   Cheral Marker, CNM [facility] [employer], [facility]

## 2019-02-23 ENCOUNTER — Encounter: Payer: 59 | Admitting: Adult Health

## 2019-02-24 ENCOUNTER — Encounter: Payer: 59 | Admitting: Adult Health

## 2019-03-05 ENCOUNTER — Encounter: Payer: Self-pay | Admitting: Adult Health

## 2019-03-05 ENCOUNTER — Encounter: Payer: Self-pay | Admitting: Obstetrics and Gynecology

## 2019-03-05 ENCOUNTER — Encounter: Payer: Self-pay | Admitting: Advanced Practice Midwife

## 2019-03-05 ENCOUNTER — Encounter: Payer: Self-pay | Admitting: Obstetrics & Gynecology

## 2019-03-07 ENCOUNTER — Telehealth: Payer: Self-pay | Admitting: Women's Health

## 2019-03-07 NOTE — Telephone Encounter (Signed)
Pt would like to see if she has a infection. States she is having "milky and yellow' discharge.

## 2019-03-08 NOTE — Telephone Encounter (Signed)
Patient states she has a vaginal discharge with odor and irritation.  Thinks she has BV.  Advised she could come to office and do self swab to check.  Pt stated she would come tomorrow.

## 2019-03-09 ENCOUNTER — Other Ambulatory Visit: Payer: 59

## 2019-03-09 ENCOUNTER — Other Ambulatory Visit: Payer: Self-pay

## 2019-03-09 DIAGNOSIS — N898 Other specified noninflammatory disorders of vagina: Secondary | ICD-10-CM

## 2019-03-18 LAB — NUSWAB VAGINITIS PLUS (VG+)
Candida albicans, NAA: NEGATIVE
Candida glabrata, NAA: NEGATIVE
Chlamydia trachomatis, NAA: NEGATIVE
Neisseria gonorrhoeae, NAA: NEGATIVE
Trich vag by NAA: NEGATIVE

## 2019-03-22 ENCOUNTER — Telehealth: Payer: Self-pay | Admitting: Obstetrics & Gynecology

## 2019-03-22 NOTE — Telephone Encounter (Signed)
Pt aware NuSwab was normal. Pt voiced understanding. JSY

## 2019-03-22 NOTE — Telephone Encounter (Signed)
Pt requesting a call with her lab results from 03/09/2019.

## 2020-03-19 ENCOUNTER — Telehealth: Payer: Self-pay | Admitting: Advanced Practice Midwife

## 2020-03-19 NOTE — Telephone Encounter (Signed)
Unable to reach pt with restrictions for upcoming appt. 

## 2020-03-20 ENCOUNTER — Encounter: Payer: Self-pay | Admitting: Advanced Practice Midwife

## 2020-03-20 ENCOUNTER — Ambulatory Visit: Payer: 59 | Admitting: Advanced Practice Midwife

## 2020-03-20 VITALS — BP 122/60 | HR 72 | Ht 66.0 in | Wt 220.0 lb

## 2020-03-20 DIAGNOSIS — Z713 Dietary counseling and surveillance: Secondary | ICD-10-CM | POA: Diagnosis not present

## 2020-03-20 DIAGNOSIS — Z3009 Encounter for other general counseling and advice on contraception: Secondary | ICD-10-CM

## 2020-03-20 NOTE — Progress Notes (Signed)
   GYN VISIT Patient name: Meghan Burton MRN [id number]  Date of birth: 09-23-1996 Chief Complaint:   Contraception  History of Present Illness:   Meghan Burton is a 24 y.o. G68P1001 African American female being seen today for discussion of contraceptive options.  Has used Nexplanon x 2 in the past (did well with the first; had too much bldg with the 2nd but never tried Megace); used OCPs but conceived on those. Had SVB Feb 2020 and hasn't used contraception since then. Isn't currently sexually active. She had a weight gain of approx 80lbs during the pregnancy and has lost approx 50lbs so far, but is interested in discussing phentermine.  Depression screen [facility] 2/9 07/05/2018 05/17/2018  Decreased Interest 0 0  Down, Depressed, Hopeless 0 0  PHQ - 2 Score 0 0  Altered sleeping 1 -  Tired, decreased energy 1 -  Change in appetite 0 -  Feeling bad or failure about yourself  0 -  Trouble concentrating 0 -  Moving slowly or fidgety/restless 0 -  Suicidal thoughts 0 -  PHQ-9 Score 2 -    Patient's last menstrual period was 03/10/2020. The current method of family planning is none.  Last pap Aug 2019. Results were:  normal Review of Systems:   Pertinent items are noted in HPI Denies fever/chills, dizziness, headaches, visual disturbances, fatigue, shortness of breath, chest pain, abdominal pain, vomiting, abnormal vaginal discharge/itching/odor/irritation, problems with periods, bowel movements, urination, or intercourse unless otherwise stated above.  Pertinent History Reviewed:  Reviewed past medical,surgical, social, obstetrical and family history.  Reviewed problem list, medications and allergies. Physical Assessment:   Vitals:   03/20/20 0934  BP: 122/60  Pulse: 72  Weight: 220 lb (99.8 kg)  Height: 5\' 6"  (1.676 m)  Body mass index is 35.51 kg/m.       Physical Examination:   General appearance: alert, well appearing, and in no distress  Mental status: alert, oriented to person,  place, and time  Skin: warm & dry   Cardiovascular: normal heart rate noted  Respiratory: normal respiratory effort, no distress  Abdomen: soft, non-tender   Pelvic: examination not indicated  Extremities: no edema   Chaperone: n/a    No results found for this or any previous visit (from the past 24 hour(s)).  Assessment & Plan:  1) Desires contraception> all options reviewed; decision for Nexplanon- not currently sexually active, but rev'd no sex x 2wks and will get UPT prior to insertion  2) Desiring additional weight loss> has had weight loss of approx 50lbs since delivery (Feb 2020) but is interested in phentermine; has 12lbs to get to 11wk NOB visit weight of 208lbs   Meds: No orders of the defined types were placed in this encounter.   No orders of the defined types were placed in this encounter.   Return for first avail; Nex insertion with JG and talk about phentermine.  05-18-2004 CNM 03/20/2020 11:59 AM

## 2020-03-22 ENCOUNTER — Telehealth: Payer: Self-pay | Admitting: Adult Health

## 2020-03-22 NOTE — Telephone Encounter (Signed)

## 2020-03-25 ENCOUNTER — Other Ambulatory Visit: Payer: Self-pay

## 2020-03-25 ENCOUNTER — Encounter: Payer: Self-pay | Admitting: Adult Health

## 2020-03-25 ENCOUNTER — Ambulatory Visit (INDEPENDENT_AMBULATORY_CARE_PROVIDER_SITE_OTHER): Payer: No Typology Code available for payment source | Admitting: Adult Health

## 2020-03-25 VITALS — BP 121/68 | HR 84 | Ht 67.0 in | Wt 214.8 lb

## 2020-03-25 DIAGNOSIS — Z6830 Body mass index (BMI) 30.0-30.9, adult: Secondary | ICD-10-CM | POA: Insufficient documentation

## 2020-03-25 DIAGNOSIS — Z3009 Encounter for other general counseling and advice on contraception: Secondary | ICD-10-CM

## 2020-03-25 DIAGNOSIS — Z6833 Body mass index (BMI) 33.0-33.9, adult: Secondary | ICD-10-CM | POA: Diagnosis not present

## 2020-03-25 DIAGNOSIS — Z713 Dietary counseling and surveillance: Secondary | ICD-10-CM | POA: Diagnosis not present

## 2020-03-25 MED ORDER — PHENTERMINE HCL 37.5 MG PO TABS: 37.5000 mg | ORAL_TABLET | Freq: Every day | ORAL | 0 refills | Status: DC

## 2020-03-25 MED ORDER — ETONOGESTREL 68 MG ~~LOC~~ IMPL: 68.0000 mg | DRUG_IMPLANT | Freq: Once | SUBCUTANEOUS | Status: DC

## 2020-03-25 NOTE — Progress Notes (Signed)
  Subjective:     Patient ID: Meghan Burton, female   DOB: 1996/03/16, 24 y.o.   MRN: [id number]  HPI Meghan Burton is a 24 year old black female, single,G1P1 in to discuss getting on adipex to help with weight loss, she has lost 70 lbs since February 2020 but has stalled, and she was supposed to get nexplanon today but has sex last week. PCP is [facility].   Review of Systems Had sex last week Weight loss has stalled, but has lost 70 lbs since February 2020  Reviewed past medical,surgical, social and family history. Reviewed medications and allergies.     Objective:   Physical Exam BP 121/68 (BP Location: Left Arm, Patient Position: Sitting, Cuff Size: Normal)   Pulse 84   Ht 5\' 7"  (1.702 m)   Wt 214 lb 12.8 oz (97.4 kg)   LMP 03/10/2020   BMI 33.64 kg/m  Skin warm and dry. Neck: mid line trachea, normal thyroid, good ROM, no lymphadenopathy noted. Lungs: clear to ausculation bilaterally. Cardiovascular: regular rate and rhythm.    Assessment:     1. Weight loss counseling, encounter for - she has lost 70 lbs since last February but has stalled, increase exercise -will rx adipex, she is aware of risks and benefits  Meds ordered this encounter  Medications  . DISCONTD: etonogestrel (NEXPLANON) implant 68 mg  . phentermine (ADIPEX-P) 37.5 MG tablet    Sig: Take 1 tablet (37.5 mg total) by mouth daily before breakfast.    Dispense:  30 tablet    Refill:  0    Order Specific Question:   Supervising Provider    Answer:   March, LUTHER H [[id number]]    2. Body mass index 33.0-33.9, adult   3. General counseling and advice on contraceptive management Discussed no sex til lnexplanon inserted, she has used in the past and like    Plan:     NO SEX Return 5/26 for nexplanon insertion

## 2020-04-02 ENCOUNTER — Telehealth: Payer: Self-pay | Admitting: Adult Health

## 2020-04-02 NOTE — Telephone Encounter (Signed)
Unable to contact patient to review covid restrictions and covid health screening questions.

## 2020-04-03 ENCOUNTER — Encounter: Payer: Medicaid Other | Admitting: Adult Health

## 2020-05-27 ENCOUNTER — Other Ambulatory Visit: Payer: Self-pay | Admitting: Adult Health

## 2020-06-12 ENCOUNTER — Ambulatory Visit: Payer: No Typology Code available for payment source | Admitting: Adult Health

## 2020-08-23 DIAGNOSIS — Z23 Encounter for immunization: Secondary | ICD-10-CM | POA: Diagnosis not present

## 2020-11-08 ENCOUNTER — Ambulatory Visit
Admission: EM | Admit: 2020-11-08 | Discharge: 2020-11-08 | Disposition: A | Discharge status: Home / Self Care | Payer: Medicaid Other | Attending: Family Medicine | Admitting: Family Medicine

## 2020-11-08 ENCOUNTER — Ambulatory Visit (INDEPENDENT_AMBULATORY_CARE_PROVIDER_SITE_OTHER): Payer: Medicaid Other

## 2020-11-08 DIAGNOSIS — M79645 Pain in left finger(s): Secondary | ICD-10-CM

## 2020-11-08 DIAGNOSIS — S60012A Contusion of left thumb without damage to nail, initial encounter: Secondary | ICD-10-CM | POA: Diagnosis not present

## 2020-11-08 DIAGNOSIS — W231XXA Caught, crushed, jammed, or pinched between stationary objects, initial encounter: Secondary | ICD-10-CM | POA: Diagnosis not present

## 2020-11-08 DIAGNOSIS — S62522A Displaced fracture of distal phalanx of left thumb, initial encounter for closed fracture: Secondary | ICD-10-CM | POA: Diagnosis not present

## 2020-11-08 DIAGNOSIS — S62525A Nondisplaced fracture of distal phalanx of left thumb, initial encounter for closed fracture: Secondary | ICD-10-CM

## 2020-11-08 NOTE — ED Triage Notes (Signed)
Pt presents with left thumb injury after shutting  thumb in door last night, bruising noted

## 2020-11-08 NOTE — Discharge Instructions (Signed)
You have a fracture of the thumb We are placing you in a splint.  Ice, ibuprofen as needed.  Follow up with orthopedics next week.

## 2020-11-11 NOTE — ED Provider Notes (Signed)
[facility]    CSN: [id number] Arrival date & time: 11/08/20  1532      History   Chief Complaint No chief complaint on file.   HPI Meghan Burton is a 25 y.o. female.   Patient is a 25 year old female presents today with left thumb injury.  This occurred after she slammed her thumb in a door last night.  Since she has had swelling, pain to the thumb.  Did place ice on the area.  No other concerns.     Past Medical History:  Diagnosis Date  . Allergy    seasonal  . Closed fracture of lateral portion of left tibial plateau 11/13/2014  . Pregnancy induced hypertension   . Tibial plateau fracture, left   . Vision abnormalities    glasses    Patient Active Problem List   Diagnosis Date Noted  . Body mass index 33.0-33.9, adult 03/25/2020  . Weight loss counseling, encounter for 03/25/2020  . General counseling and advice on contraceptive management 03/25/2020    Past Surgical History:  Procedure Laterality Date  . MYRINGOTOMY WITH TUBE PLACEMENT    . ORIF TIBIA PLATEAU Left 11/13/2014   Procedure: OPEN REDUCTION INTERNAL FIXATION (ORIF) LEFT LATERAL TIBIAL PLATEAU;  Surgeon: Eulas Post, MD;  Location: [facility];  Service: Orthopedics;  Laterality: Left;    OB History    Gravida  1   Para  1   Term  1   Preterm      AB      Living  1     SAB      IAB      Ectopic      Multiple  0   Live Births  1            Home Medications    Prior to Admission medications   Medication Sig Start Date End Date Taking? Authorizing Provider  phentermine (ADIPEX-P) 37.5 MG tablet Take 1 tablet (37.5 mg total) by mouth daily before breakfast. 03/25/20   [name], NP    Family History Family History  Problem Relation Age of Onset  . Thyroid disease Maternal Grandmother   . Hypertension Maternal Grandmother   . Hypertension Mother     Social History Social History   Tobacco Use  . Smoking status: Never Smoker   . Smokeless tobacco: Never Used  Vaping Use  . Vaping Use: Never used  Substance Use Topics  . Alcohol use: No  . Drug use: No     Allergies   Patient has no known allergies.   Review of Systems Review of Systems   Physical Exam Triage Vital Signs ED Triage Vitals  Enc Vitals Group     BP 11/08/20 1551 124/81     Pulse Rate 11/08/20 1551 85     Resp 11/08/20 1551 15     Temp 11/08/20 1551 98.4 F (36.9 C)     Temp src --      SpO2 11/08/20 1551 98 %     Weight --      Height --      Head Circumference --      Peak Flow --      Pain Score 11/08/20 1556 6     Pain Loc --      Pain Edu? --      Excl. in GC? --    No data found.  Updated Vital Signs BP 124/81   Pulse 85  Temp 98.4 F (36.9 C)   Resp 15   LMP  (LMP Unknown)   SpO2 98%   Visual Acuity Right Eye Distance:   Left Eye Distance:   Bilateral Distance:    Right Eye Near:   Left Eye Near:    Bilateral Near:     Physical Exam Vitals and nursing note reviewed.  Constitutional:      General: She is not in acute distress.    Appearance: Normal appearance. She is not ill-appearing, toxic-appearing or diaphoretic.  HENT:     Head: Normocephalic.     Nose: Nose normal.  Eyes:     Conjunctiva/sclera: Conjunctivae normal.  Pulmonary:     Effort: Pulmonary effort is normal.  Musculoskeletal:        General: Normal range of motion.       Hands:     Cervical back: Normal range of motion.     Comments: Swelling, bruising and tenderness.  Limited flexion  Skin:    General: Skin is warm and dry.     Findings: No rash.  Neurological:     Mental Status: She is alert.  Psychiatric:        Mood and Affect: Mood normal.      UC Treatments / Results  Labs (all labs ordered are listed, but only abnormal results are displayed) Labs Reviewed - No data to display  EKG   Radiology No results found.  Procedures Procedures (including critical care time)  Medications Ordered in  UC Medications - No data to display  Initial Impression / Assessment and Plan / UC Course  I have reviewed the triage vital signs and the nursing notes.  Pertinent labs & imaging results that were available during my care of the patient were reviewed by me and considered in my medical decision making (see chart for details).     Closed nondisplaced fracture of the distal phalanx of the left thumb Splint placed here in clinic. Recommended rest, ice, ibuprofen as needed Recommend follow-up with orthopedics next week Final Clinical Impressions(s) / UC Diagnoses   Final diagnoses:  Closed nondisplaced fracture of distal phalanx of left thumb, initial encounter     Discharge Instructions     You have a fracture of the thumb We are placing you in a splint.  Ice, ibuprofen as needed.  Follow up with orthopedics next week.     ED Prescriptions    None     PDMP not reviewed this encounter.   Janace Aris, NP 11/11/20 1622

## 2020-11-15 ENCOUNTER — Ambulatory Visit (INDEPENDENT_AMBULATORY_CARE_PROVIDER_SITE_OTHER): Payer: 59 | Admitting: Orthopedic Surgery

## 2020-11-15 ENCOUNTER — Encounter: Payer: Self-pay | Admitting: Orthopedic Surgery

## 2020-11-15 ENCOUNTER — Other Ambulatory Visit: Payer: Self-pay

## 2020-11-15 VITALS — BP 131/78 | HR 73 | Ht 66.0 in | Wt 180.5 lb

## 2020-11-15 DIAGNOSIS — S62525A Nondisplaced fracture of distal phalanx of left thumb, initial encounter for closed fracture: Secondary | ICD-10-CM

## 2020-11-15 NOTE — Progress Notes (Signed)
New Patient Visit  Assessment: Meghan Burton is a 24 y.o. female with the following: Small, minimally displaced fracture at the base of the left thumb distal phalanx  Plan: Sustained small, minimally displaced fracture at the bast of the left thumb distal phalanx.  Minimal swelling and tenderness on exam.  IP joint is stable.  She has continued to work using an alumafoam splint.  She was fitted for a removable thumb spica splint that she can use as needed for the next few weeks.  She will let us know if she is having ongoing issues; no follow scheduled as a result.    Follow-up: Return if symptoms worsen or fail to improve.  Subjective:  Chief Complaint  Patient presents with  . Hand Injury    L Thumb.Shut car door on thumb DOI 11/07/20    History of Present Illness: Meghan Burton is a 25 y.o. RHD female who presents for evaluation of left thumb pain.  Approximately 1 week ago, she reports that she slammed a car door on her thumb.  She was evaluated at an urgent care and placed in an alumafoam splint for a distal phalanx fracture.  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  Pain is well controlled.  Occasional discomfort with some motions.  No numbness or tingling.    Review of Systems: No fevers or chills No numbness or tingling No chest pain No shortness of breath No bowel or bladder dysfunction No GI distress No headaches   Medical History:  Past Medical History:  Diagnosis Date  . Allergy    seasonal  . Closed fracture of lateral portion of left tibial plateau 11/13/2014  . Pregnancy induced hypertension   . Tibial plateau fracture, left   . Vision abnormalities    glasses    Past Surgical History:  Procedure Laterality Date  . MYRINGOTOMY WITH TUBE PLACEMENT    . ORIF TIBIA PLATEAU Left 11/13/2014   Procedure: OPEN REDUCTION INTERNAL FIXATION (ORIF) LEFT LATERAL TIBIAL PLATEAU;  Surgeon: Eulas Post, MD;  Location: [facility];   Service: Orthopedics;  Laterality: Left;    Family History  Problem Relation Age of Onset  . Thyroid disease Maternal Grandmother   . Hypertension Maternal Grandmother   . Hypertension Mother    Social History   Tobacco Use  . Smoking status: Never Smoker  . Smokeless tobacco: Never Used  Vaping Use  . Vaping Use: Never used  Substance Use Topics  . Alcohol use: No  . Drug use: No    No Known Allergies  No outpatient medications have been marked as taking for the 11/15/20 encounter (Office Visit) with Oliver Barre, MD.    Objective: BP 131/78   Pulse 73   Ht 5\' 6"  (1.676 m)   Wt 180 lb 8 oz (81.9 kg)   LMP  (LMP Unknown)   BMI 29.13 kg/m   Physical Exam:  General:  Alert and oriented, no acute distress.  Gait: Normal  Left thumb without swelling. Mild TTP at IP joint, on radial aspect.  IP joint is stable.  Active motion of the IP joint is tolerated.  Fingers are WWP      IMAGING: I personally reviewed images previously obtained from the ED   XR left thumb shows small, minimally displaced fracture at the radial aspect of the distal phalanx.   New Medications:  No orders of the defined types were placed in this encounter.  Oliver Barre, MD  11/15/2020 10:18 PM

## 2021-03-19 ENCOUNTER — Telehealth (HOSPITAL_BASED_OUTPATIENT_CLINIC_OR_DEPARTMENT_OTHER): Payer: Self-pay

## 2021-03-19 NOTE — Telephone Encounter (Signed)
Called to reschedule Pt for an appropriate NP timeslot

## 2021-03-28 ENCOUNTER — Ambulatory Visit (HOSPITAL_BASED_OUTPATIENT_CLINIC_OR_DEPARTMENT_OTHER): Payer: 59 | Admitting: Nurse Practitioner

## 2021-04-01 ENCOUNTER — Other Ambulatory Visit: Payer: Self-pay

## 2021-04-01 ENCOUNTER — Ambulatory Visit (INDEPENDENT_AMBULATORY_CARE_PROVIDER_SITE_OTHER): Payer: 59 | Admitting: Nurse Practitioner

## 2021-04-01 ENCOUNTER — Encounter (HOSPITAL_BASED_OUTPATIENT_CLINIC_OR_DEPARTMENT_OTHER): Payer: Self-pay | Admitting: Nurse Practitioner

## 2021-04-01 VITALS — BP 117/59 | HR 105 | Ht 66.0 in | Wt 187.5 lb

## 2021-04-01 DIAGNOSIS — Z13228 Encounter for screening for other metabolic disorders: Secondary | ICD-10-CM | POA: Diagnosis not present

## 2021-04-01 DIAGNOSIS — Z130 Encounter for screening for diseases of the blood and blood-forming organs and certain disorders involving the immune mechanism: Secondary | ICD-10-CM | POA: Diagnosis not present

## 2021-04-01 DIAGNOSIS — Z7689 Persons encountering health services in other specified circumstances: Secondary | ICD-10-CM

## 2021-04-01 DIAGNOSIS — Z0000 Encounter for general adult medical examination without abnormal findings: Secondary | ICD-10-CM

## 2021-04-01 DIAGNOSIS — K645 Perianal venous thrombosis: Secondary | ICD-10-CM | POA: Diagnosis not present

## 2021-04-01 DIAGNOSIS — Z113 Encounter for screening for infections with a predominantly sexual mode of transmission: Secondary | ICD-10-CM | POA: Diagnosis not present

## 2021-04-01 DIAGNOSIS — Z6830 Body mass index (BMI) 30.0-30.9, adult: Secondary | ICD-10-CM

## 2021-04-01 DIAGNOSIS — Z1159 Encounter for screening for other viral diseases: Secondary | ICD-10-CM

## 2021-04-01 DIAGNOSIS — Z1321 Encounter for screening for nutritional disorder: Secondary | ICD-10-CM

## 2021-04-01 DIAGNOSIS — Z1329 Encounter for screening for other suspected endocrine disorder: Secondary | ICD-10-CM

## 2021-04-01 MED ORDER — TRAMADOL HCL 50 MG PO TABS: 50.0000 mg | ORAL_TABLET | Freq: Four times a day (QID) | ORAL | 0 refills | Status: AC | PRN

## 2021-04-01 NOTE — Patient Instructions (Addendum)
Recommendations from today's visit: .   Information on diet, exercise, and health maintenance recommendations are listed below. This is information to help you be sure you are on track for optimal health and monitoring.   Please look over this and let us know if you have any questions or if you have completed any of the health maintenance outside of [place] so that we can be sure your records are up to date.  ___________________________________________________________  Thank you for choosing [place] at [facility] for your Primary Care needs. I am excited for the opportunity to partner with you to meet your health care goals. It was a pleasure meeting you today!  I am an Adult-Geriatric Nurse Practitioner with a background in caring for patients for more than 20 years. I received my Paediatric nurse in Nursing and my Doctor of Nursing Practice degrees at [employer]. I received additional fellowship training in primary care and sports medicine after receiving my doctorate degree. I provide primary care and sports medicine services to patients age 42 and older within this office. I am also a provider with the [facility] and the director of the APP Fellowship with [facility].  I am a [place] native, but have called the [place] area home for nearly 20 years and am proud to be a member of this community.   I am passionate about providing the best service to you through preventive medicine and supportive care. I consider you a part of the medical team and value your input. I work diligently to ensure that you are heard and your needs are met in a safe and effective manner. I want you to feel comfortable with me as your provider and want you to know that your health concerns are important to me.   For your information, our office hours are Monday- Friday 8:00 AM - 5:00 PM At this time I am not in the office on Wednesdays.  If  you have questions or concerns, please call our office at [phone] or send [place] a MyChart message and we will respond as quickly as possible.   For all urgent or time sensitive needs we ask that you please call the office to avoid delays. MyChart is not constantly monitored and replies may take up to 72 business hours.  MyChart Policy: . MyChart allows for you to see your visit notes, after visit summary, provider recommendations, lab and tests results, make an appointment, request refills, and contact your provider or the office for non-urgent questions or concerns.  . Providers are seeing patients during normal business hours and do not have built in time to review MyChart messages. We ask that you allow a minimum of 72 business hours for MyChart message responses.  . Complex MyChart concerns may require a visit. Your provider may request you schedule a virtual or in person visit to ensure we are providing the best care possible. . MyChart messages sent after 4:00 PM on Friday will not be received by the provider until Monday morning.    Lab and Test Results: . You will receive your lab and test results on MyChart as soon as they are completed and results have been sent by the lab or testing facility. Due to this service, you will receive your results BEFORE your provider.  . Please allow a minimum of 72 business hours for your provider to receive and review lab and test results and contact you about.   Marland Kitchen  Most lab and test result comments from the provider will be sent through [place]. Your provider may recommend changes to the plan of care, follow-up visits, repeat testing, ask questions, or request an office visit to discuss these results. You may reply directly to this message or call the office at [phone] to provide information for the provider or set up an appointment. . In some instances, you will be called with test results and recommendations. Please let us know if this is preferred  and we will make note of this in your chart to provide this for you.    . If you have not heard a response to your lab or test results in 72 business hours, please call the office to let us know.   After Hours: . For all non-emergency after hours needs, please call the office at [phone] and select the option to reach the on-call provider service. On-call services are shared between multiple [place] offices and therefore it will not be possible to speak directly with your provider. On-call providers may provide medical advice and recommendations, but are unable to provide refills for maintenance medications.  . For all emergency or urgent medical needs after normal business hours, we recommend that you seek care at the closest Urgent Care or Emergency Department to ensure appropriate treatment in a timely manner.  [facility] has a 24 hour emergency room located on the ground floor for your convenience.    Please do not hesitate to reach out to [place] with concerns.   Thank you, again, for choosing me as your health care partner. I appreciate your trust and look forward to learning more about you.   Worthy Keeler, DNP, AGNP-c ___________________________________________________________  Health Maintenance Recommendations Screening Testing  Mammogram  Every 1 -2 years based on history and risk factors  Starting at age 20  Pap Smear  Ages 21-39 every 3 years  Ages 82-65 every 5 years with HPV testing  More frequent testing may be required based on results and history  Colon Cancer Screening  Every 1-10 years based on test performed, risk factors, and history  Starting at age 81  Bone Density Screening  Every 2-10 years based on history  Starting at age 23 for women  Recommendations for men differ based on medication usage, history, and risk factors  AAA Screening  One time ultrasound  Men 63-58 years old who have every smoked  Lung  Cancer Screening  Low Dose Lung CT every 12 months  Age 7-80 years with a 30 pack-year smoking history who still smoke or who have quit within the last 15 years  Screening Labs  Routine  Labs: Complete Blood Count (CBC), Complete Metabolic Panel (CMP), Cholesterol (Lipid Panel)  Every 6-12 months based on history and medications  May be recommended more frequently based on current conditions or previous results  Hemoglobin A1c Lab  Every 3-12 months based on history and previous results  Starting at age 79 or earlier with diagnosis of diabetes, high cholesterol, BMI >26, and/or risk factors  Frequent monitoring for patients with diabetes to ensure blood sugar control  Thyroid Panel (TSH w/ T3 & T4)  Every 6 months based on history, symptoms, and risk factors  May be repeated more often if on medication  HIV  One time testing for all patients 54 and older  May be repeated more frequently for patients with increased risk factors or exposure  Hepatitis C  One time testing for all  patients 65 and older  May be repeated more frequently for patients with increased risk factors or exposure  Gonorrhea, Chlamydia  Every 12 months for all sexually active persons 13-24 years  Additional monitoring may be recommended for those who are considered high risk or who have symptoms  PSA  Men 22-73 years old with risk factors  Additional screening may be recommended from age 22-69 based on risk factors, symptoms, and history  Vaccine Recommendations  Tetanus Booster  All adults every 10 years  Flu Vaccine  All patients 6 months and older every year  COVID Vaccine  All patients 12 years and older  Initial dosing with booster  May recommend additional booster based on age and health history  HPV Vaccine  2 doses all patients age 7-26  Dosing may be considered for patients over 26  Shingles Vaccine (Shingrix)  2 doses all adults 74 years and older  Pneumonia  (Pneumovax 23)  All adults 84 years and older  May recommend earlier dosing based on health history  Pneumonia (Prevnar 91)  All adults 25 years and older  Dosed 1 year after Pneumovax 23  Additional Screening, Testing, and Vaccinations may be recommended on an individualized basis based on family history, health history, risk factors, and/or exposure.  __________________________________________________________  Diet Recommendations for All Patients  I recommend that all patients maintain a diet low in saturated fats, carbohydrates, and cholesterol. While this can be challenging at first, it is not impossible and small changes can make big differences.  Things to try: Marland Kitchen Decreasing the amount of soda, sweet tea, and/or juice to one or less per day and replace with water o While water is always the first choice, if you do not like water you may consider - adding a water additive without sugar to improve the taste - other sugar free drinks . Replace potatoes with a brightly colored vegetable at dinner . Use healthy oils, such as canola oil or olive oil, instead of butter or hard margarine . Limit your bread intake to two pieces or less a day . Replace regular pasta with low carb pasta options . Bake, broil, or grill foods instead of frying . Monitor portion sizes  . Eat smaller, more frequent meals throughout the day instead of large meals  An important thing to remember is, if you love foods that are not great for your health, you don't have to give them up completely. Instead, allow these foods to be a reward when you have done well. Allowing yourself to still have special treats every once in a while is a nice way to tell yourself thank you for working hard to keep yourself healthy.   Also remember that every day is a new day. If you have a bad day and "fall off the wagon", you can still climb right back up and keep moving along on your journey!  We have resources available to help  you!  Some websites that may be helpful include: . www.http://carter.biz/  . Www.VeryWellFit.com _____________________________________________________________  Activity Recommendations for All Patients  I recommend that all adults get at least 20 minutes of moderate physical activity that elevates your heart rate at least 5 days out of the week.  Some examples include: . Walking or jogging at a pace that allows you to carry on a conversation . Cycling (stationary bike or outdoors) . Water aerobics . Yoga . Weight lifting . Dancing If physical limitations prevent you from putting stress on your joints, exercise  in a pool or seated in a chair are excellent options.  Do determine your MAXIMUM heart rate for activity: YOUR AGE - 220 = MAX HeartRate   Remember! . Do not push yourself too hard.  . Start slowly and build up your pace, speed, weight, time in exercise, etc.  . Allow your body to rest between exercise and get good sleep. . You will need more water than normal when you are exerting yourself. Do not wait until you are thirsty to drink. Drink with a purpose of getting in at least 8, 8 ounce glasses of water a day plus more depending on how much you exercise and sweat.    If you begin to develop dizziness, chest pain, abdominal pain, jaw pain, shortness of breath, headache, vision changes, lightheadedness, or other concerning symptoms, stop the activity and allow your body to rest. If your symptoms are severe, seek emergency evaluation immediately. If your symptoms are concerning, but not severe, please let us know so that we can recommend further evaluation.   ________________________________________________________________   Nonsurgical Procedures for Hemorrhoids, Care After This sheet gives you information about how to care for yourself after your procedure. Your health care provider may also give you more specific instructions. If you have problems or questions, contact your health  care provider. What can I expect after the procedure? After the procedure, it is common to have:  Slight rectal bleeding for a few days.  Soreness or a dull ache in the rectal area. Follow these instructions at home: Medicines  Take over-the-counter and prescription medicines only as told by your health care provider.  Use a stool softener or a bulk laxative as told by your health care provider. Activity  Return to your normal activities as told by your health care provider. Ask your health care provider what activities are safe for you.  Do not lift anything that is heavier than 10 lb (4.5 kg), or the limit that you are told, until your health care provider says that it is safe.  Do not sit for long periods of time without moving. Take a walk every day or as told by your health care provider.   Managing pain and swelling  Take warm sitz baths for 20 minutes, 3-4 times a day to ease pain and discomfort. You may do this in a bathtub or using a portable sitz bath that fits over the toilet.  If directed, apply ice to the affected area. Using ice packs between sitz baths may be helpful. ? Put ice in a plastic bag. ? Place a towel between your skin and the bag. ? Leave the ice on for 20 minutes, 2-3 times a day. Eating and drinking  Eat foods that have a lot of fiber in them, such as whole grains, beans, nuts, fruits, and vegetables.  Drink enough fluid to keep your urine pale yellow.   General instructions  Do not strain to have a bowel movement.  Do not spend a long time sitting on the toilet.  Keep all follow-up visits as told by your health care provider. This is important. Contact a health care provider if:  Your pain medicine is not helping.  You have a fever.  You become constipated.  You continue to have light rectal bleeding for more than a few days.  You are unable to pass urine (urinary retention). Get help right away if you have:  Very bad rectal  pain.  Heavy bleeding from your rectum. Summary  After  the procedure, it is common to have slight rectal bleeding and soreness in the area.  Taking warm sitz baths and applying ice may be helpful to relieve the discomfort.  Eat foods that have a lot of fiber in them, such as whole grains, beans, nuts, fruits, and vegetables.  Get help right away if you have excessive pain or heavy bleeding from your rectum. This information is not intended to replace advice given to you by your health care provider. Make sure you discuss any questions you have with your health care provider. Document Revised: 02/17/2019 Document Reviewed: 03/17/2018 Elsevier Patient Education  [place].

## 2021-04-01 NOTE — Progress Notes (Signed)
Meghan Clamp, DNP, AGNP-c Primary Care Services ______________________________________________________________________________________________________________________________________________  HPI Meghan Burton is a 25 y.o. female presenting to [facility] at [facility] today to establish care.   Patient Care Team: Jeran Hiltz, Sung Amabile, NP as PCP - General (Nurse Practitioner) Last CPE: unknown  Concerns today: . Hemorrhoids o Patient reports intermittent hemorrhoid pain since having son two years ago. o Recently hemorrhoids have become increasingly painful, not responsive to OTC medication o No hard to pass stool, straining, blood in stool o Endorses pain and tenderness of rectum and sensation of fullness   Narrative: Meghan Burton is single with a two year old son, Meghan Burton, at home. 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  She reports she is safe in his current relationships and home environment.  She does not have a history or partner abuse.  She endorses walking a lot with work- takes the steps, eats a fairly balanced diet, likes snacks.  She denies nicotine use, deniesrecreational drug use, denies alcohol use . She is currently sexually active with 2+ partners in the past year.  She reports regular menstrual periods with mild cramping.  She is not planning pregnancy in the near future.  Contraceptive options include condoms most of the time She reports STI history of STDs: trichomoniasis and reports wishes for STI testing today.  She denies recent changes to bowel habits, denies recent changes to bladder habits, denies recent changes to skin.  She denies recent mood related changes. PHQ and GAD listed below.  PHQ9 Today: Depression screen [facility] 2/9 04/01/2021 07/05/2018 05/17/2018  Decreased Interest 0 0 0  Down, Depressed, Hopeless 0 0 0  PHQ - 2 Score 0 0 0  Altered sleeping 0 1 -  Tired, decreased energy 0 1 -  Change in  appetite 0 0 -  Feeling bad or failure about yourself  0 0 -  Trouble concentrating 0 0 -  Moving slowly or fidgety/restless 0 0 -  Suicidal thoughts - 0 -  PHQ-9 Score 0 2 -   GAD7 Today: GAD 7 : Generalized Anxiety Score 04/01/2021  Nervous, Anxious, on Edge 0  Control/stop worrying 0  Worry too much - different things 0  Trouble relaxing 0  Restless 0  Easily annoyed or irritable 0  Afraid - awful might happen 0  Total GAD 7 Score 0    Health Maintenance Due  Topic Date Due  . COVID-19 Vaccine (1) Never done  . HPV VACCINES (1 - 2-dose series) Never done  . Hepatitis C Screening  Never done     PMH Past Medical History:  Diagnosis Date  . Allergy    seasonal  . Closed fracture of lateral portion of left tibial plateau 11/13/2014  . Pregnancy induced hypertension   . Tibial plateau fracture, left   . Vision abnormalities    glasses    ROS All review of systems negative except what is listed in the HPI  PHYSICAL EXAM General Appearance:  awake, alert, oriented, in no acute distress and well developed, well nourished Skin:  skin color, texture, turgor are normal; there are no bruises, rashes or lesions. Head/face:  NCAT Eyes:  No gross abnormalities., PERRL, EOMI and Sclera nonicteric Ears:  canals and TMs NI Nose/Sinuses:  Nares normal. Septum midline. Mucosa normal. No drainage or sinus tenderness. Mouth/Throat:  Mucosa moist, no lesions; pharynx without erythema, edema or exudate. Neck:  neck- supple, no mass, non-tender, no bruits and no jvd Back:  no pain to palpation, good flexion and extension, motor and sensory appear to be normal Lungs:  Normal expansion.  Clear to auscultation.  No rales, rhonchi, or wheezing., No chest wall tenderness. Heart:  Heart sounds are normal.  Regular rate and rhythm without murmur, gallop or rub. Abdomen:  Soft, non-tender, normal bowel sounds; no bruits, organomegaly or masses. Musculoskeletal:  Spine range of motion normal.  Muscular strength intact., Range of motion normal in hips, knees, shoulders, and spine. Peripheral Pulses:  Capillary refill <2secs, strong peripheral pulses Neurologic:  Alert and oriented x 3, gait normal., reflexes normal and symmetric, strength and  sensation grossly normal Rectal:  Thrombosed external hemorrhoids located at 12 o'clock and 6 o'clock position on the anus. No fissures or tears present. Anal tone normal. CMA Zona Wilfoung present during examination. Psych exam:alert,oriented, in NAD with a full range of affect, normal behavior and no psychotic features  ASSESSMENT AND PLAN Problem List Items Addressed This Visit    BMI 30.0-30.9,adult    Diet and exercise recommendations provided.       Encounter to establish care - Primary    Review of current and past medical history, social history, medication, and family history.  Review of care gaps and health maintenance recommendations.  Records from recent providers to be requested if not available in Chart Review or Care Everywhere.  Recommendations for health maintenance, diet, and exercise provided.        Encounter for annual physical exam    CPE with labs today Due for pap in August Recommendations for health maintenance provided Hemorrhoids removed with no other concerning findings present.       Thrombosed external hemorrhoid    Hemorrhoid removal with witness, Agapito Games, CMA present. Rectum visualized in side lying position. Rectum and surrounding area cleasned with soap and water followed by chlorhexidine swab. Total 1.44mL 1% lidocaine infiltrated into base of hemorrhoids to create nerve block. #11 blade utilized to incise 2 external hemorrhoids at 6 o'clock and 12 o'clock positions. Coagulated blood evacuated and excess tissue removed. Silver nitrate utilized to aid in coagulation of blood at incision site. Bleeding minimal. Antibacterial ointment applied and 4x4 gauze placed at incision site for protection.  Recommendations for sits baths/frequent bottle washes discussed. Avoidance of lifting heavy objects and straining discussed. Stool softeners may be used for bowel movements.  Warnings and precautions discussed with information provided on handout. Patient tolerated procedure well.  Tramadol provided for pain relief for up to 5 days. F/U if symptoms return or if warning signs are discovered.       Relevant Medications   traMADol (ULTRAM) 50 MG tablet   Screening for deficiency anemia   Relevant Orders   CBC with Differential/Platelet    Other Visit Diagnoses    Routine screening for STI (sexually transmitted infection)       Relevant Orders   RPR+HIV+GC+CT Panel   Encounter for hepatitis C screening test for low risk patient       Relevant Orders   Hepatitis C Antibody   Screening for endocrine, nutritional, metabolic and immunity disorder       Relevant Orders   Comprehensive metabolic panel      Education provided today during visit and on AVS for patient to review at home.  Diet and Exercise recommendations provided.  Current diagnoses and recommendations discussed. HM recommendations reviewed with recommendations.    Outpatient Encounter Medications as of 04/01/2021  Medication Sig  . traMADol (ULTRAM) 50 MG tablet Take  1 tablet (50 mg total) by mouth every 6 (six) hours as needed for up to 5 days.   No facility-administered encounter medications on file as of 04/01/2021.    Return if symptoms worsen or fail to improve, for August for pap.  Time: 70 minutes, >50% spent counseling, care coordination, chart review, and documentation.   Tollie Eth, DNP, AGNP-c

## 2021-04-01 NOTE — Assessment & Plan Note (Signed)
Hemorrhoid removal with witness, Agapito Games, CMA present. Rectum visualized in side lying position. Rectum and surrounding area cleasned with soap and water followed by chlorhexidine swab. Total 1.18mL 1% lidocaine infiltrated into base of hemorrhoids to create nerve block. #11 blade utilized to incise 2 external hemorrhoids at 6 o'clock and 12 o'clock positions. Coagulated blood evacuated and excess tissue removed. Silver nitrate utilized to aid in coagulation of blood at incision site. Bleeding minimal. Antibacterial ointment applied and 4x4 gauze placed at incision site for protection. Recommendations for sits baths/frequent bottle washes discussed. Avoidance of lifting heavy objects and straining discussed. Stool softeners may be used for bowel movements.  Warnings and precautions discussed with information provided on handout. Patient tolerated procedure well.  Tramadol provided for pain relief for up to 5 days. F/U if symptoms return or if warning signs are discovered.

## 2021-04-01 NOTE — Assessment & Plan Note (Signed)
CPE with labs today Due for pap in August Recommendations for health maintenance provided Hemorrhoids removed with no other concerning findings present.

## 2021-04-01 NOTE — Assessment & Plan Note (Signed)
Diet and exercise recommendations provided.

## 2021-04-01 NOTE — Assessment & Plan Note (Signed)
Review of current and past medical history, social history, medication, and family history.  Review of care gaps and health maintenance recommendations.  Records from recent providers to be requested if not available in Chart Review or Care Everywhere.  Recommendations for health maintenance, diet, and exercise provided.   

## 2021-04-02 LAB — COMPREHENSIVE METABOLIC PANEL
ALT: 18 IU/L (ref 0–32)
AST: 16 IU/L (ref 0–40)
Albumin/Globulin Ratio: 1.8 (ref 1.2–2.2)
Albumin: 4.8 g/dL (ref 3.9–5.0)
Alkaline Phosphatase: 74 IU/L (ref 44–121)
BUN/Creatinine Ratio: 7 — ABNORMAL LOW (ref 9–23)
BUN: 6 mg/dL (ref 6–20)
Bilirubin Total: 0.3 mg/dL (ref 0.0–1.2)
CO2: 21 mmol/L (ref 20–29)
Calcium: 9.6 mg/dL (ref 8.7–10.2)
Chloride: 101 mmol/L (ref 96–106)
Creatinine, Ser: 0.82 mg/dL (ref 0.57–1.00)
Globulin, Total: 2.6 g/dL (ref 1.5–4.5)
Glucose: 82 mg/dL (ref 65–99)
Potassium: 4.3 mmol/L (ref 3.5–5.2)
Sodium: 137 mmol/L (ref 134–144)
Total Protein: 7.4 g/dL (ref 6.0–8.5)
eGFR: 102 mL/min/{1.73_m2} (ref >59)

## 2021-04-02 LAB — RPR+HIV+GC+CT PANEL
HIV Screen 4th Generation wRfx: NONREACTIVE
RPR Ser Ql: NONREACTIVE

## 2021-04-02 LAB — HEPATITIS C ANTIBODY: Hep C Virus Ab: <0.1 s/co ratio (ref 0.0–0.9)

## 2021-04-03 LAB — CBC WITH DIFFERENTIAL/PLATELET
Basophils Absolute: 0 10*3/uL (ref 0.0–0.2)
Basos: 1 %
EOS (ABSOLUTE): 0.2 10*3/uL (ref 0.0–0.4)
Eos: 2 %
Hematocrit: 41.5 % (ref 34.0–46.6)
Hemoglobin: 13.3 g/dL (ref 11.1–15.9)
Immature Grans (Abs): 0 10*3/uL (ref 0.0–0.1)
Immature Granulocytes: 0 %
Lymphocytes Absolute: 3.2 10*3/uL — ABNORMAL HIGH (ref 0.7–3.1)
Lymphs: 39 %
MCH: 25.3 pg — ABNORMAL LOW (ref 26.6–33.0)
MCHC: 32 g/dL (ref 31.5–35.7)
MCV: 79 fL (ref 79–97)
Monocytes Absolute: 0.8 10*3/uL (ref 0.1–0.9)
Monocytes: 9 %
Neutrophils Absolute: 4.2 10*3/uL (ref 1.4–7.0)
Neutrophils: 49 %
Platelets: 318 10*3/uL (ref 150–450)
RBC: 5.25 x10E6/uL (ref 3.77–5.28)
RDW: 13.6 % (ref 11.7–15.4)
WBC: 8.4 10*3/uL (ref 3.4–10.8)

## 2021-04-03 LAB — SPECIMEN STATUS REPORT

## 2021-04-03 LAB — HEPATITIS C ANTIBODY: Hep C Virus Ab: <0.1 s/co ratio (ref 0.0–0.9)

## 2021-04-09 NOTE — Progress Notes (Signed)
Please notify patient:  Kidney, liver, and electrolytes looks good. No signs of elevation blood sugar.  STI testing negative. Hemoglobin and blood counts are much improved from the past. Recommend a daily vitamin with Iron to help keep levels normal.  No changes to plan of care.

## 2021-04-10 ENCOUNTER — Telehealth (HOSPITAL_BASED_OUTPATIENT_CLINIC_OR_DEPARTMENT_OTHER): Payer: Self-pay

## 2021-04-10 NOTE — Telephone Encounter (Signed)
-----   Message from Tollie Eth, NP sent at 04/09/2021 11:10 AM EDT ----- Please notify patient:  Kidney, liver, and electrolytes looks good. No signs of elevation blood sugar.  STI testing negative. Hemoglobin and blood counts are much improved from the past. Recommend a daily vitamin with Iron to help keep levels normal.  No changes to plan of care.

## 2021-04-10 NOTE — Telephone Encounter (Signed)
Called patient to go over lab results and recommendations.  Patient is aware and agreeable.  Instructed her to contact the office with any questions and/or concerns.

## 2021-05-15 ENCOUNTER — Other Ambulatory Visit (INDEPENDENT_AMBULATORY_CARE_PROVIDER_SITE_OTHER): Payer: 59

## 2021-05-15 ENCOUNTER — Other Ambulatory Visit (HOSPITAL_COMMUNITY)
Admission: RE | Admit: 2021-05-15 | Discharge: 2021-05-15 | Disposition: A | Discharge status: Home / Self Care | Payer: 59 | Source: Ambulatory Visit | Attending: Obstetrics & Gynecology | Admitting: Obstetrics & Gynecology

## 2021-05-15 ENCOUNTER — Other Ambulatory Visit: Payer: Self-pay

## 2021-05-15 DIAGNOSIS — Z113 Encounter for screening for infections with a predominantly sexual mode of transmission: Secondary | ICD-10-CM

## 2021-05-15 NOTE — Progress Notes (Addendum)
   NURSE VISIT- VAGINITIS/STD/POC  SUBJECTIVE:  Meghan Burton is a 25 y.o. G1P1001 GYN patientfemale here for a vaginal swab for vaginitis screening, STD screen.  She reports the following symptoms: discharge described as white, malodorous, and chunky, post coital bleeding, and vulvar itching for 4 days. Denies abnormal vaginal bleeding, significant pelvic pain, fever, or UTI symptoms.  OBJECTIVE:  There were no vitals taken for this visit.  Appears well, in no apparent distress  ASSESSMENT: Vaginal swab for  vaginitis & STD screening  PLAN: Self-collected vaginal probe for Gonorrhea, Chlamydia, Trichomonas, Bacterial Vaginosis, Yeast sent to lab Treatment: to be determined once results are received Follow-up as needed if symptoms persist/worsen, or new symptoms develop  Chuckie Mccathern A Nikia Mangino  05/15/2021 11:56 AM   Chart reviewed for nurse visit. Agree with plan of care.  Cheral Marker, [place] 05/15/2021 12:10 PM

## 2021-05-18 LAB — CERVICOVAGINAL ANCILLARY ONLY
Bacterial Vaginitis (gardnerella): POSITIVE — AB
Candida Glabrata: NEGATIVE
Candida Vaginitis: POSITIVE — AB
Chlamydia: POSITIVE — AB
Comment: NEGATIVE
Comment: NEGATIVE
Comment: NEGATIVE
Comment: NEGATIVE
Comment: NEGATIVE
Comment: NORMAL
Neisseria Gonorrhea: NEGATIVE
Trichomonas: NEGATIVE

## 2021-05-19 ENCOUNTER — Telehealth: Payer: Self-pay | Admitting: Adult Health

## 2021-05-19 ENCOUNTER — Other Ambulatory Visit: Payer: Self-pay | Admitting: Women's Health

## 2021-05-19 ENCOUNTER — Encounter: Payer: Self-pay | Admitting: Women's Health

## 2021-05-19 DIAGNOSIS — A749 Chlamydial infection, unspecified: Secondary | ICD-10-CM | POA: Insufficient documentation

## 2021-05-19 MED ORDER — METRONIDAZOLE 500 MG PO TABS: 500.0000 mg | ORAL_TABLET | Freq: Two times a day (BID) | ORAL | 0 refills | Status: DC

## 2021-05-19 MED ORDER — DOXYCYCLINE HYCLATE 100 MG PO CAPS: 100.0000 mg | ORAL_CAPSULE | Freq: Two times a day (BID) | ORAL | 0 refills | Status: DC

## 2021-05-19 NOTE — Telephone Encounter (Signed)
Pt saw her result in MyChart & would like any necessary meds sent to [facility]  Please advise & notify pt

## 2021-05-19 NOTE — Telephone Encounter (Signed)
Pt called before provider saw results. Meghan Burton has sent pt a mychart msg and prescription into pharmacy. Pt has read msg.

## 2021-06-18 ENCOUNTER — Other Ambulatory Visit: Payer: Self-pay

## 2021-06-18 ENCOUNTER — Other Ambulatory Visit (INDEPENDENT_AMBULATORY_CARE_PROVIDER_SITE_OTHER): Payer: 59 | Admitting: *Deleted

## 2021-06-18 ENCOUNTER — Other Ambulatory Visit (HOSPITAL_COMMUNITY)
Admission: RE | Admit: 2021-06-18 | Discharge: 2021-06-18 | Disposition: A | Discharge status: Home / Self Care | Payer: 59 | Source: Ambulatory Visit | Attending: Obstetrics & Gynecology | Admitting: Obstetrics & Gynecology

## 2021-06-18 DIAGNOSIS — Z113 Encounter for screening for infections with a predominantly sexual mode of transmission: Secondary | ICD-10-CM | POA: Insufficient documentation

## 2021-06-18 NOTE — Progress Notes (Signed)
   NURSE VISIT- VAGINITIS/STD/POC  SUBJECTIVE:  Meghan Burton is a 25 y.o. G1P1001 GYN patientfemale here for a vaginal swab for STD screen.  She reports the following symptoms: none for 0 days. Denies abnormal vaginal bleeding, significant pelvic pain, fever, or UTI symptoms.  OBJECTIVE:  There were no vitals taken for this visit.  Appears well, in no apparent distress  ASSESSMENT: Vaginal swab for STD screen  PLAN: Self-collected vaginal probe for Gonorrhea, Chlamydia, Trichomonas, Bacterial Vaginosis, Yeast sent to lab Treatment: to be determined once results are received Follow-up as needed if symptoms persist/worsen, or new symptoms develop  Annamarie Dawley  06/18/2021 2:22 PM

## 2021-06-18 NOTE — Progress Notes (Signed)
Chart reviewed for nurse visit. Agree with plan of care.  [name], NP 06/18/2021 2:30 PM

## 2021-06-20 ENCOUNTER — Telehealth: Payer: Self-pay | Admitting: Adult Health

## 2021-06-20 ENCOUNTER — Encounter: Payer: Self-pay | Admitting: Adult Health

## 2021-06-20 DIAGNOSIS — A599 Trichomoniasis, unspecified: Secondary | ICD-10-CM

## 2021-06-20 HISTORY — DX: Trichomoniasis, unspecified: A59.9

## 2021-06-20 LAB — CERVICOVAGINAL ANCILLARY ONLY
Bacterial Vaginitis (gardnerella): NEGATIVE
Candida Glabrata: NEGATIVE
Candida Vaginitis: POSITIVE — AB
Chlamydia: NEGATIVE
Comment: NEGATIVE
Comment: NEGATIVE
Comment: NEGATIVE
Comment: NEGATIVE
Comment: NEGATIVE
Comment: NORMAL
Neisseria Gonorrhea: NEGATIVE
Trichomonas: POSITIVE — AB

## 2021-06-20 MED ORDER — METRONIDAZOLE 500 MG PO TABS: 500.0000 mg | ORAL_TABLET | Freq: Two times a day (BID) | ORAL | 0 refills | Status: DC

## 2021-06-20 MED ORDER — FLUCONAZOLE 150 MG PO TABS: ORAL_TABLET | ORAL | 1 refills | Status: DC

## 2021-06-20 NOTE — Telephone Encounter (Signed)
Left message that vaginal swab +trich and yeast, sent rx in for flagyl and diflucan. Meghan Burton is STD, no sex during treatment and partner needs to be treated, he can go to his doctor or health dept, or I can treat, will need name,dob,any allergies and drug store, you can do proof of cure in 2 weeks by self swab

## 2021-06-23 ENCOUNTER — Ambulatory Visit (HOSPITAL_BASED_OUTPATIENT_CLINIC_OR_DEPARTMENT_OTHER): Payer: 59 | Admitting: Nurse Practitioner

## 2021-06-23 NOTE — Progress Notes (Deleted)
Established Patient Office Visit  Subjective:  Patient ID: Meghan Burton, female    DOB: May 07, 1996  Age: 25 y.o. MRN: [id number]  CC: No chief complaint on file.   HPI Meghan Burton presents for ***  Past Medical History:  Diagnosis Date   Allergy    seasonal   Closed fracture of lateral portion of left tibial plateau 11/13/2014   Pregnancy induced hypertension    Tibial plateau fracture, left    Trichimoniasis 06/20/2021   06/20/21 treated    Vision abnormalities    glasses    Past Surgical History:  Procedure Laterality Date   MYRINGOTOMY WITH TUBE PLACEMENT     ORIF TIBIA PLATEAU Left 11/13/2014   Procedure: OPEN REDUCTION INTERNAL FIXATION (ORIF) LEFT LATERAL TIBIAL PLATEAU;  Surgeon: Johnny Bridge, MD;  Location: [facility];  Service: Orthopedics;  Laterality: Left;    Family History  Problem Relation Age of Onset   Thyroid disease Maternal Grandmother    Hypertension Maternal Grandmother    Hypertension Mother     Social History   Socioeconomic History   Marital status: Single    Spouse name: Gordy Levan   Number of children: 1   Years of education: 12   Highest education level: 12th grade  Occupational History   Not on file  Tobacco Use   Smoking status: Never   Smokeless tobacco: Never  Vaping Use   Vaping Use: Never used  Substance and Sexual Activity   Alcohol use: No   Drug use: No   Sexual activity: Yes    Birth control/protection: None  Other Topics Concern   Not on file  Social History Narrative   Not on file   Social Determinants of Health   Financial Resource Strain: Not on file  Food Insecurity: Not on file  Transportation Needs: Not on file  Physical Activity: Not on file  Stress: Not on file  Social Connections: Not on file  Intimate Partner Violence: Not on file    Outpatient Medications Prior to Visit  Medication Sig Dispense Refill   doxycycline (VIBRAMYCIN) 100 MG capsule Take 1 capsule (100 mg  total) by mouth 2 (two) times daily. X 7 days 14 capsule 0   fluconazole (DIFLUCAN) 150 MG tablet Take 1 now and 1 in 3 days 2 tablet 1   metroNIDAZOLE (FLAGYL) 500 MG tablet Take 1 tablet (500 mg total) by mouth 2 (two) times daily. 14 tablet 0   No facility-administered medications prior to visit.    No Known Allergies  ROS Review of Systems    Objective:    Physical Exam  There were no vitals taken for this visit. Wt Readings from Last 3 Encounters:  04/01/21 187 lb 8 oz (85 kg)  11/15/20 180 lb 8 oz (81.9 kg)  03/25/20 214 lb 12.8 oz (97.4 kg)     Health Maintenance Due  Topic Date Due   COVID-19 Vaccine (1) Never done   HPV VACCINES (1 - 2-dose series) Never done   INFLUENZA VACCINE  06/09/2021   PAP-Cervical Cytology Screening  07/05/2021   PAP SMEAR-Modifier  07/05/2021       Topic Date Due   HPV VACCINES (1 - 2-dose series) Never done    No results found for: TSH Lab Results  Component Value Date   WBC 8.4 04/01/2021   HGB 13.3 04/01/2021   HCT 41.5 04/01/2021   MCV 79 04/01/2021   PLT 318 04/01/2021   Lab Results  Component  Value Date   NA 137 04/01/2021   K 4.3 04/01/2021   CO2 21 04/01/2021   GLUCOSE 82 04/01/2021   BUN 6 04/01/2021   CREATININE 0.82 04/01/2021   BILITOT 0.3 04/01/2021   ALKPHOS 74 04/01/2021   AST 16 04/01/2021   ALT 18 04/01/2021   PROT 7.4 04/01/2021   ALBUMIN 4.8 04/01/2021   CALCIUM 9.6 04/01/2021   ANIONGAP 7 12/20/2018   EGFR 102 04/01/2021   No results found for: CHOL No results found for: HDL No results found for: LDLCALC No results found for: TRIG No results found for: CHOLHDL No results found for: HGBA1C    Assessment & Plan:   Problem List Items Addressed This Visit   None   No orders of the defined types were placed in this encounter.   Follow-up: No follow-ups on file.    Orma Render, NP

## 2021-06-24 ENCOUNTER — Telehealth: Payer: Self-pay | Admitting: Nurse Practitioner

## 2021-06-27 ENCOUNTER — Ambulatory Visit (HOSPITAL_BASED_OUTPATIENT_CLINIC_OR_DEPARTMENT_OTHER): Payer: 59 | Admitting: Nurse Practitioner

## 2021-06-27 NOTE — Progress Notes (Deleted)
Established Patient Office Visit  Subjective:  Patient ID: Meghan Burton, female    DOB: 05/24/1996  Age: 25 y.o. MRN: [id number]  CC: No chief complaint on file.   HPI Meghan Burton presents for ***  Past Medical History:  Diagnosis Date   Allergy    seasonal   Closed fracture of lateral portion of left tibial plateau 11/13/2014   Pregnancy induced hypertension    Tibial plateau fracture, left    Trichimoniasis 06/20/2021   06/20/21 treated    Vision abnormalities    glasses    Past Surgical History:  Procedure Laterality Date   MYRINGOTOMY WITH TUBE PLACEMENT     ORIF TIBIA PLATEAU Left 11/13/2014   Procedure: OPEN REDUCTION INTERNAL FIXATION (ORIF) LEFT LATERAL TIBIAL PLATEAU;  Surgeon: Johnny Bridge, MD;  Location: [place];  Service: Orthopedics;  Laterality: Left;    Family History  Problem Relation Age of Onset   Thyroid disease Maternal Grandmother    Hypertension Maternal Grandmother    Hypertension Mother     Social History   Socioeconomic History   Marital status: Single    Spouse name: Gordy Levan   Number of children: 1   Years of education: 12   Highest education level: 12th grade  Occupational History   Not on file  Tobacco Use   Smoking status: Never   Smokeless tobacco: Never  Vaping Use   Vaping Use: Never used  Substance and Sexual Activity   Alcohol use: No   Drug use: No   Sexual activity: Yes    Birth control/protection: None  Other Topics Concern   Not on file  Social History Narrative   Not on file   Social Determinants of Health   Financial Resource Strain: Not on file  Food Insecurity: Not on file  Transportation Needs: Not on file  Physical Activity: Not on file  Stress: Not on file  Social Connections: Not on file  Intimate Partner Violence: Not on file    Outpatient Medications Prior to Visit  Medication Sig Dispense Refill   doxycycline (VIBRAMYCIN) 100 MG capsule Take 1 capsule (100 mg  total) by mouth 2 (two) times daily. X 7 days 14 capsule 0   fluconazole (DIFLUCAN) 150 MG tablet Take 1 now and 1 in 3 days 2 tablet 1   metroNIDAZOLE (FLAGYL) 500 MG tablet Take 1 tablet (500 mg total) by mouth 2 (two) times daily. 14 tablet 0   No facility-administered medications prior to visit.    No Known Allergies  ROS Review of Systems    Objective:    Physical Exam  There were no vitals taken for this visit. Wt Readings from Last 3 Encounters:  04/01/21 187 lb 8 oz (85 kg)  11/15/20 180 lb 8 oz (81.9 kg)  03/25/20 214 lb 12.8 oz (97.4 kg)     Health Maintenance Due  Topic Date Due   COVID-19 Vaccine (1) Never done   PAP-Cervical Cytology Screening  07/05/2021   PAP SMEAR-Modifier  07/05/2021    There are no preventive care reminders to display for this patient.  No results found for: TSH Lab Results  Component Value Date   WBC 8.4 04/01/2021   HGB 13.3 04/01/2021   HCT 41.5 04/01/2021   MCV 79 04/01/2021   PLT 318 04/01/2021   Lab Results  Component Value Date   NA 137 04/01/2021   K 4.3 04/01/2021   CO2 21 04/01/2021   GLUCOSE 82 04/01/2021  BUN 6 04/01/2021   CREATININE 0.82 04/01/2021   BILITOT 0.3 04/01/2021   ALKPHOS 74 04/01/2021   AST 16 04/01/2021   ALT 18 04/01/2021   PROT 7.4 04/01/2021   ALBUMIN 4.8 04/01/2021   CALCIUM 9.6 04/01/2021   ANIONGAP 7 12/20/2018   EGFR 102 04/01/2021   No results found for: CHOL No results found for: HDL No results found for: LDLCALC No results found for: TRIG No results found for: CHOLHDL No results found for: HGBA1C    Assessment & Plan:   Problem List Items Addressed This Visit   None   No orders of the defined types were placed in this encounter.   Follow-up: No follow-ups on file.    Orma Render, NP

## 2021-07-04 ENCOUNTER — Encounter (HOSPITAL_BASED_OUTPATIENT_CLINIC_OR_DEPARTMENT_OTHER): Payer: Self-pay | Admitting: Nurse Practitioner

## 2021-07-08 NOTE — Telephone Encounter (Signed)
No additional notes needed  

## 2021-07-10 ENCOUNTER — Other Ambulatory Visit: Payer: 59

## 2021-07-17 ENCOUNTER — Other Ambulatory Visit (INDEPENDENT_AMBULATORY_CARE_PROVIDER_SITE_OTHER): Payer: 59

## 2021-07-17 ENCOUNTER — Other Ambulatory Visit: Payer: Self-pay

## 2021-07-17 ENCOUNTER — Other Ambulatory Visit (HOSPITAL_COMMUNITY)
Admission: RE | Admit: 2021-07-17 | Discharge: 2021-07-17 | Disposition: A | Discharge status: Home / Self Care | Payer: 59 | Source: Ambulatory Visit | Attending: Obstetrics & Gynecology | Admitting: Obstetrics & Gynecology

## 2021-07-17 DIAGNOSIS — Z113 Encounter for screening for infections with a predominantly sexual mode of transmission: Secondary | ICD-10-CM

## 2021-07-17 DIAGNOSIS — N898 Other specified noninflammatory disorders of vagina: Secondary | ICD-10-CM | POA: Insufficient documentation

## 2021-07-17 NOTE — Progress Notes (Signed)
Chart reviewed for nurse visit. Agree with plan of care.  Cyril Mourning A, NP 07/17/2021 1:32 PM

## 2021-07-17 NOTE — Progress Notes (Signed)
   NURSE VISIT- VAGINITIS/STD/POC  SUBJECTIVE:  Meghan Burton is a 25 y.o. G1P1001 GYN patientfemale here for a vaginal swab for vaginitis screening, STD screen.  She reports the following symptoms: discharge described as white and malodorous, odor, and vulvar itching for 1 week. Denies abnormal vaginal bleeding, significant pelvic pain, fever, or UTI symptoms.  OBJECTIVE:  There were no vitals taken for this visit.  Appears well, in no apparent distress  ASSESSMENT: Vaginal swab for  vaginitis & STD screening  PLAN: Self-collected vaginal probe for Gonorrhea, Chlamydia, Trichomonas, Bacterial Vaginosis, Yeast sent to lab Treatment: to be determined once results are received Follow-up as needed if symptoms persist/worsen, or new symptoms develop  Sharese Manrique A Jolanta Cabeza  07/17/2021 8:36 AM

## 2021-07-18 ENCOUNTER — Other Ambulatory Visit: Payer: Self-pay | Admitting: Adult Health

## 2021-07-18 LAB — CERVICOVAGINAL ANCILLARY ONLY
Bacterial Vaginitis (gardnerella): POSITIVE — AB
Candida Glabrata: NEGATIVE
Candida Vaginitis: POSITIVE — AB
Chlamydia: NEGATIVE
Comment: NEGATIVE
Comment: NEGATIVE
Comment: NEGATIVE
Comment: NEGATIVE
Comment: NEGATIVE
Comment: NORMAL
Neisseria Gonorrhea: NEGATIVE
Trichomonas: NEGATIVE

## 2021-07-18 MED ORDER — FLUCONAZOLE 150 MG PO TABS: ORAL_TABLET | ORAL | 1 refills | Status: DC

## 2021-07-18 MED ORDER — METRONIDAZOLE 500 MG PO TABS: 500.0000 mg | ORAL_TABLET | Freq: Two times a day (BID) | ORAL | 0 refills | Status: DC

## 2021-07-18 NOTE — Progress Notes (Signed)
+  BV and Yeast on vaginal swab will rx flagyl and diflucan

## 2021-07-22 ENCOUNTER — Ambulatory Visit (HOSPITAL_BASED_OUTPATIENT_CLINIC_OR_DEPARTMENT_OTHER): Payer: 59 | Admitting: Nurse Practitioner

## 2021-07-24 ENCOUNTER — Ambulatory Visit
Admission: EM | Admit: 2021-07-24 | Discharge: 2021-07-24 | Disposition: A | Discharge status: Home / Self Care | Payer: 59 | Attending: Emergency Medicine | Admitting: Emergency Medicine

## 2021-07-24 ENCOUNTER — Encounter: Payer: Self-pay | Admitting: *Deleted

## 2021-07-24 ENCOUNTER — Other Ambulatory Visit: Payer: Self-pay

## 2021-07-24 DIAGNOSIS — Z113 Encounter for screening for infections with a predominantly sexual mode of transmission: Secondary | ICD-10-CM | POA: Diagnosis not present

## 2021-07-24 DIAGNOSIS — R1031 Right lower quadrant pain: Secondary | ICD-10-CM | POA: Diagnosis not present

## 2021-07-24 NOTE — ED Provider Notes (Signed)
[facility]   [id number] 07/24/21 Arrival Time: 1610   CC: RLQ pain  SUBJECTIVE:  Meghan Burton is a 25 y.o. female who presents with complaints of RT lower abdominal discomfort that radiates to back and neck.  Last sex on 9/4.  2 partners over the past 6 months.  Denies pain.  Denies alleviating or aggravating factors  Recently treated for BV and yeast.  She denies fever, chills, nausea, vomiting, urinary symptoms, vaginal itching, vaginal odor, vaginal bleeding, dyspareunia, vaginal rashes or lesions.   Patient's last menstrual period was 07/17/2021.   ROS: As per HPI.  All other pertinent ROS negative.     Past Medical History:  Diagnosis Date   Allergy    seasonal   Closed fracture of lateral portion of left tibial plateau 11/13/2014   Pregnancy induced hypertension    Tibial plateau fracture, left    Trichimoniasis 06/20/2021   06/20/21 treated    Vision abnormalities    glasses   Past Surgical History:  Procedure Laterality Date   MYRINGOTOMY WITH TUBE PLACEMENT     ORIF TIBIA PLATEAU Left 11/13/2014   Procedure: OPEN REDUCTION INTERNAL FIXATION (ORIF) LEFT LATERAL TIBIAL PLATEAU;  Surgeon: Eulas Post, MD;  Location: [facility];  Service: Orthopedics;  Laterality: Left;   No Known Allergies No current facility-administered medications on file prior to encounter.   Current Outpatient Medications on File Prior to Encounter  Medication Sig Dispense Refill   doxycycline (VIBRAMYCIN) 100 MG capsule Take 1 capsule (100 mg total) by mouth 2 (two) times daily. X 7 days 14 capsule 0   fluconazole (DIFLUCAN) 150 MG tablet Take 1 now and 1 in 3 days 2 tablet 1   metroNIDAZOLE (FLAGYL) 500 MG tablet Take 1 tablet (500 mg total) by mouth 2 (two) times daily. 14 tablet 0    Social History   Socioeconomic History   Marital status: Single    Spouse name: Niel Hummer   Number of children: 1   Years of education: 12   Highest education level:  12th grade  Occupational History   Not on file  Tobacco Use   Smoking status: Never   Smokeless tobacco: Never  Vaping Use   Vaping Use: Never used  Substance and Sexual Activity   Alcohol use: No   Drug use: No   Sexual activity: Yes    Birth control/protection: None  Other Topics Concern   Not on file  Social History Narrative   Not on file   Social Determinants of Health   Financial Resource Strain: Not on file  Food Insecurity: Not on file  Transportation Needs: Not on file  Physical Activity: Not on file  Stress: Not on file  Social Connections: Not on file  Intimate Partner Violence: Not on file   Family History  Problem Relation Age of Onset   Thyroid disease Maternal Grandmother    Hypertension Maternal Grandmother    Hypertension Mother     OBJECTIVE:  Vitals:   07/24/21 1621  BP: 110/72  Pulse: 92  Resp: 18  Temp: 98.5 F (36.9 C)  SpO2: 98%     General appearance: Alert, NAD, appears stated age Head: NCAT Throat: lips, mucosa, and tongue normal; teeth and gums normal Lungs: CTA bilaterally without adventitious breath sounds Heart: regular rate and rhythm.  Abdomen: soft, non-tender; bowel sounds normal; no masses or organomegaly; no guarding or rebound tenderness GU: declines Skin: warm and dry Psychological:  Alert and cooperative. Normal  mood and affect.  ASSESSMENT & PLAN:  1. RLQ discomfort   2. Screening examination for STD (sexually transmitted disease)    Unable to rule out appendicitis in urgent care setting.  Offered patient further evaluation and management in the ED.  Patient declines at this time and would like to try outpatient therapy first.  Aware of the risk associated with this decision including missed diagnosis, organ damage, organ failure, and/or death.  Patient aware and in agreement.     Vaginal self-swab obtained.  We will follow up with you regarding abnormal results HIV/ syphilis testing today If tests results are  positive, please abstain from sexual activity until you and your partner(s) have been treated Follow up with PCP  Return here or go to ER if you have any new or worsening symptoms fever, chills, nausea, vomiting, abdominal or pelvic pain, painful intercourse, vaginal discharge, vaginal bleeding, persistent symptoms despite treatment, etc...  Reviewed expectations re: course of current medical issues. Questions answered. Outlined signs and symptoms indicating need for more acute intervention. Patient verbalized understanding. After Visit Summary given.        Rennis Harding, PA-C 07/24/21 1637

## 2021-07-24 NOTE — Discharge Instructions (Addendum)
Unable to rule out appendicitis in urgent care setting.  Offered patient further evaluation and management in the ED.  Patient declines at this time and would like to try outpatient therapy first.  Aware of the risk associated with this decision including missed diagnosis, organ damage, organ failure, and/or death.  Patient aware and in agreement.     Vaginal self-swab obtained.  We will follow up with you regarding abnormal results HIV/ syphilis testing today If tests results are positive, please abstain from sexual activity until you and your partner(s) have been treated Follow up with PCP  Return here or go to ER if you have any new or worsening symptoms fever, chills, nausea, vomiting, abdominal or pelvic pain, painful intercourse, vaginal discharge, vaginal bleeding, persistent symptoms despite treatment, etc..Marland Kitchen

## 2021-07-24 NOTE — ED Triage Notes (Signed)
Pt reports Rt pelvic pain to back up to neck. Pt also wants STD check

## 2021-07-25 LAB — HIV ANTIBODY (ROUTINE TESTING W REFLEX): HIV Screen 4th Generation wRfx: NONREACTIVE

## 2021-07-25 LAB — RPR: RPR Ser Ql: NONREACTIVE

## 2021-07-28 LAB — CERVICOVAGINAL ANCILLARY ONLY
Bacterial Vaginitis (gardnerella): NEGATIVE
Candida Glabrata: NEGATIVE
Candida Vaginitis: POSITIVE — AB
Chlamydia: NEGATIVE
Comment: NEGATIVE
Comment: NEGATIVE
Comment: NEGATIVE
Comment: NEGATIVE
Comment: NEGATIVE
Comment: NORMAL
Neisseria Gonorrhea: NEGATIVE
Trichomonas: NEGATIVE

## 2021-07-29 ENCOUNTER — Telehealth (HOSPITAL_COMMUNITY): Payer: Self-pay | Admitting: Emergency Medicine

## 2021-07-29 MED ORDER — FLUCONAZOLE 150 MG PO TABS: 150.0000 mg | ORAL_TABLET | Freq: Once | ORAL | 0 refills | Status: AC

## 2021-07-30 ENCOUNTER — Other Ambulatory Visit: Payer: Self-pay

## 2021-07-30 ENCOUNTER — Other Ambulatory Visit (INDEPENDENT_AMBULATORY_CARE_PROVIDER_SITE_OTHER): Payer: 59

## 2021-07-30 ENCOUNTER — Other Ambulatory Visit (HOSPITAL_COMMUNITY)
Admission: RE | Admit: 2021-07-30 | Discharge: 2021-07-30 | Disposition: A | Discharge status: Home / Self Care | Payer: 59 | Source: Ambulatory Visit | Attending: Obstetrics & Gynecology | Admitting: Obstetrics & Gynecology

## 2021-07-30 DIAGNOSIS — N898 Other specified noninflammatory disorders of vagina: Secondary | ICD-10-CM

## 2021-07-30 NOTE — Progress Notes (Addendum)
   NURSE VISIT- VAGINITIS/STD  SUBJECTIVE:  Lamica Burton is a 25 y.o. G1P1001 GYN patientfemale here for a vaginal swab for vaginitis screening, STD screen.  She reports the following symptoms: discharge described as white, curd-like, malodorous, and green and odor for 5 days. Denies abnormal vaginal bleeding, significant pelvic pain, fever, or UTI symptoms.  OBJECTIVE:  LMP 07/17/2021   Appears well, in no apparent distress  ASSESSMENT: Vaginal swab for  vaginitis & STD screening  PLAN: Self-collected vaginal probe for Gonorrhea, Chlamydia, Trichomonas, Bacterial Vaginosis, Yeast sent to lab Treatment: to be determined once results are received Follow-up as needed if symptoms persist/worsen, or new symptoms develop  Umar Patmon A Fayette Gasner  07/30/2021 3:58 PM   Attestation of Attending Supervision of Nursing Visit Encounter: Evaluation and management procedures were performed by the nursing staff under my supervision and collaboration.  I have reviewed the nurse's note and chart, and I agree with the management and plan.  Rockne Coons MD Attending Physician for the Center for [facility] 07/30/2021 5:38 PM   Attestation of Attending Supervision of Nursing Visit Encounter: Evaluation and management procedures were performed by the nursing staff under my supervision and collaboration.  I have reviewed the nurse's note and chart, and I agree with the management and plan.  Rockne Coons MD Attending Physician for the Center for [facility] 08/03/2021 5:04 PM

## 2021-08-01 LAB — CERVICOVAGINAL ANCILLARY ONLY
Bacterial Vaginitis (gardnerella): POSITIVE — AB
Candida Glabrata: NEGATIVE
Candida Vaginitis: POSITIVE — AB
Chlamydia: NEGATIVE
Comment: NEGATIVE
Comment: NEGATIVE
Comment: NEGATIVE
Comment: NEGATIVE
Comment: NEGATIVE
Comment: NORMAL
Neisseria Gonorrhea: NEGATIVE
Trichomonas: NEGATIVE

## 2021-08-04 ENCOUNTER — Other Ambulatory Visit: Payer: Self-pay | Admitting: Adult Health

## 2021-08-04 MED ORDER — METRONIDAZOLE 500 MG PO TABS: 500.0000 mg | ORAL_TABLET | Freq: Two times a day (BID) | ORAL | 0 refills | Status: DC

## 2021-08-04 MED ORDER — FLUCONAZOLE 150 MG PO TABS: ORAL_TABLET | ORAL | 1 refills | Status: DC

## 2021-08-13 ENCOUNTER — Other Ambulatory Visit: Payer: Self-pay

## 2021-08-13 ENCOUNTER — Other Ambulatory Visit (HOSPITAL_COMMUNITY)
Admission: RE | Admit: 2021-08-13 | Discharge: 2021-08-13 | Disposition: A | Discharge status: Home / Self Care | Payer: 59 | Source: Ambulatory Visit | Attending: Adult Health | Admitting: Adult Health

## 2021-08-13 ENCOUNTER — Ambulatory Visit (INDEPENDENT_AMBULATORY_CARE_PROVIDER_SITE_OTHER): Payer: 59 | Admitting: Adult Health

## 2021-08-13 ENCOUNTER — Encounter: Payer: Self-pay | Admitting: Adult Health

## 2021-08-13 VITALS — BP 117/79 | HR 71 | Ht 66.0 in | Wt 189.5 lb

## 2021-08-13 DIAGNOSIS — Z3202 Encounter for pregnancy test, result negative: Secondary | ICD-10-CM | POA: Diagnosis not present

## 2021-08-13 DIAGNOSIS — Z124 Encounter for screening for malignant neoplasm of cervix: Secondary | ICD-10-CM

## 2021-08-13 DIAGNOSIS — Z30011 Encounter for initial prescription of contraceptive pills: Secondary | ICD-10-CM

## 2021-08-13 LAB — POCT URINE PREGNANCY: Preg Test, Ur: NEGATIVE

## 2021-08-13 MED ORDER — LO LOESTRIN FE 1 MG-10 MCG / 10 MCG PO TABS: 1.0000 | ORAL_TABLET | Freq: Every day | ORAL | 4 refills | Status: DC

## 2021-08-13 NOTE — Progress Notes (Signed)
  Subjective:     Patient ID: Meghan Burton, female   DOB: [birth year], 25 y.o.   MRN: [id number]  HPI Meghan Burton is a 25 year old black female, single, G1P1 in to start on birth control pills. She needs a pap too. PCP is Enid Skeens NP. Lab Results  Component Value Date   DIAGPAP  07/05/2018    NEGATIVE FOR INTRAEPITHELIAL LESIONS OR MALIGNANCY.    Review of Systems Patient denies any headaches, hearing loss, fatigue, blurred vision, shortness of breath, chest pain, abdominal pain, problems with bowel movements, urination, or intercourse. No joint pain or mood swings. Reviewed past medical,surgical, social and family history. Reviewed medications and allergies.     Objective:   Physical Exam BP 117/79 (BP Location: Left Arm, Patient Position: Sitting, Cuff Size: Normal)   Pulse 71   Ht 5\' 6"  (1.676 m)   Wt 189 lb 8 oz (86 kg)   LMP 08/09/2021   BMI 30.59 kg/m  UPT is negative.Skin warm and dry. Lungs: clear to ausculation bilaterally. Cardiovascular: regular rate and rhythm.    Pelvic: external genitalia is normal in appearance no lesions, vagina: +period blood,urethra has no lesions or masses noted, cervix:smooth and bulbous, pap with GC/CHL performed,uterus: normal size, shape and contour, non tender, no masses felt, adnexa: no masses or tenderness noted. Bladder is non tender and no masses felt.  She has lost almost a 100 lbs since baby was born.  Fall risk is low  [facility] - 08/13/21 1057       Pregnancy Intention Screening   Does the patient want to become pregnant in the next year? No    Does the patient's partner want to become pregnant in the next year? No    Would the patient like to discuss contraceptive options today? Yes      Contraception Wrap Up   Current Method No Method - Other Reason    End Method Oral Contraceptive    Contraception Counseling Provided Yes            Examination chaperoned by 10/13/21 LPN  Assessment:     1. Pregnancy examination or test,  negative result   2. Routine cervical smear Pap sent with GC/CHL Pap in 3 years if normal Physical in 1 year  3. Encounter for initial prescription of contraceptive pills She denies Stroke, MI, DVT, breast cancer and migraines with aura, will start lo loestrin this Sunday, use condoms for 1 pack, and take at same time daily, I gave her 2 sample packs,too. Meds ordered this encounter  Medications   Norethindrone-Ethinyl Estradiol-Fe Biphas (LO LOESTRIN FE) 1 MG-10 MCG / 10 MCG tablet    Sig: Take 1 tablet by mouth daily. Take 1 daily by mouth    Dispense:  84 tablet    Refill:  4    BIN Sunday, PCN CN, GRP [id number]    Order Specific Question:   Supervising Provider    Answer:   [id number] [[id number]]       Plan:     Physical in 1 year,if has at PCP can cancel here

## 2021-08-14 LAB — CYTOLOGY - PAP
Chlamydia: NEGATIVE
Comment: NEGATIVE
Comment: NORMAL
Diagnosis: NEGATIVE
Neisseria Gonorrhea: NEGATIVE

## 2021-09-08 ENCOUNTER — Other Ambulatory Visit (INDEPENDENT_AMBULATORY_CARE_PROVIDER_SITE_OTHER): Payer: 59

## 2021-09-08 ENCOUNTER — Other Ambulatory Visit: Payer: Self-pay

## 2021-09-08 ENCOUNTER — Other Ambulatory Visit (HOSPITAL_COMMUNITY)
Admission: RE | Admit: 2021-09-08 | Discharge: 2021-09-08 | Disposition: A | Discharge status: Home / Self Care | Payer: 59 | Source: Ambulatory Visit | Attending: Obstetrics & Gynecology | Admitting: Obstetrics & Gynecology

## 2021-09-08 DIAGNOSIS — Z113 Encounter for screening for infections with a predominantly sexual mode of transmission: Secondary | ICD-10-CM | POA: Insufficient documentation

## 2021-09-08 DIAGNOSIS — N898 Other specified noninflammatory disorders of vagina: Secondary | ICD-10-CM | POA: Insufficient documentation

## 2021-09-08 NOTE — Progress Notes (Signed)
Chart reviewed for nurse visit. Agree with plan of care.  [name], NP 09/08/2021 3:04 PM

## 2021-09-08 NOTE — Progress Notes (Signed)
   NURSE VISIT- VAGINITIS/STD  SUBJECTIVE:  Meghan Burton is a 25 y.o. G1P1001 GYN patientfemale here for a vaginal swab for vaginitis screening, STD screen.  She reports the following symptoms: discharge described as creamy, malodorous, vulvar erythema noted, and pink and odor for 1 week. Denies abnormal vaginal bleeding, significant pelvic pain, fever, or UTI symptoms.  OBJECTIVE:  LMP 08/09/2021   Appears well, in no apparent distress  ASSESSMENT: Vaginal swab for  vaginitis & STD screening  PLAN: Self-collected vaginal probe for Gonorrhea, Chlamydia, Trichomonas, Bacterial Vaginosis, Yeast sent to lab Treatment: to be determined once results are received Follow-up as needed if symptoms persist/worsen, or new symptoms develop  Brieana Shimmin A Louisa Favaro  09/08/2021 2:58 PM

## 2021-09-11 LAB — CERVICOVAGINAL ANCILLARY ONLY
Bacterial Vaginitis (gardnerella): POSITIVE — AB
Candida Glabrata: NEGATIVE
Candida Vaginitis: POSITIVE — AB
Chlamydia: NEGATIVE
Comment: NEGATIVE
Comment: NEGATIVE
Comment: NEGATIVE
Comment: NEGATIVE
Comment: NEGATIVE
Comment: NORMAL
Neisseria Gonorrhea: NEGATIVE
Trichomonas: NEGATIVE

## 2021-09-12 ENCOUNTER — Other Ambulatory Visit: Payer: Self-pay | Admitting: Adult Health

## 2021-09-12 MED ORDER — METRONIDAZOLE 500 MG PO TABS: 500.0000 mg | ORAL_TABLET | Freq: Two times a day (BID) | ORAL | 0 refills | Status: DC

## 2021-09-12 MED ORDER — FLUCONAZOLE 150 MG PO TABS: ORAL_TABLET | ORAL | 1 refills | Status: DC

## 2021-09-12 NOTE — Progress Notes (Signed)
+  BV and yeast on vaginal swab rx flagyl and diflucan  

## 2021-12-16 ENCOUNTER — Other Ambulatory Visit (HOSPITAL_COMMUNITY)
Admission: RE | Admit: 2021-12-16 | Discharge: 2021-12-16 | Disposition: A | Discharge status: Home / Self Care | Payer: 59 | Source: Ambulatory Visit | Attending: Obstetrics & Gynecology | Admitting: Obstetrics & Gynecology

## 2021-12-16 ENCOUNTER — Other Ambulatory Visit (INDEPENDENT_AMBULATORY_CARE_PROVIDER_SITE_OTHER): Payer: 59

## 2021-12-16 ENCOUNTER — Other Ambulatory Visit: Payer: Self-pay

## 2021-12-16 DIAGNOSIS — N898 Other specified noninflammatory disorders of vagina: Secondary | ICD-10-CM | POA: Insufficient documentation

## 2021-12-16 NOTE — Progress Notes (Signed)
° °  NURSE VISIT- VAGINITIS/STD  SUBJECTIVE:  Rayza Crannell is a 26 y.o. G1P1001 GYN patientfemale here for a vaginal swab for vaginitis screening, STD screen.  She reports the following symptoms: mild cramping, local irritation and odor for 1 week.  Similar symptoms to last time when she had BV back in October. Denies abnormal vaginal bleeding, significant pelvic pain, fever, or UTI symptoms.  OBJECTIVE:  There were no vitals taken for this visit.  Appears well, in no apparent distress  ASSESSMENT: Vaginal swab for vaginitis screening/STD screen  PLAN: Self-collected vaginal probe for Gonorrhea, Chlamydia, Trichomonas, Bacterial Vaginosis, Yeast sent to lab Treatment: to be determined once results are received Follow-up as needed if symptoms persist/worsen, or new symptoms develop  Alice Rieger  12/16/2021 3:29 PM

## 2021-12-18 ENCOUNTER — Other Ambulatory Visit: Payer: Self-pay | Admitting: Adult Health

## 2021-12-18 LAB — CERVICOVAGINAL ANCILLARY ONLY
Bacterial Vaginitis (gardnerella): POSITIVE — AB
Candida Glabrata: NEGATIVE
Candida Vaginitis: NEGATIVE
Chlamydia: NEGATIVE
Comment: NEGATIVE
Comment: NEGATIVE
Comment: NEGATIVE
Comment: NEGATIVE
Comment: NEGATIVE
Comment: NORMAL
Neisseria Gonorrhea: NEGATIVE
Trichomonas: NEGATIVE

## 2021-12-18 MED ORDER — METRONIDAZOLE 500 MG PO TABS: 500.0000 mg | ORAL_TABLET | Freq: Two times a day (BID) | ORAL | 0 refills | Status: DC

## 2021-12-18 NOTE — Progress Notes (Signed)
Vaginal swab +BV will rx flagyl 

## 2022-02-25 ENCOUNTER — Other Ambulatory Visit (HOSPITAL_COMMUNITY)
Admission: RE | Admit: 2022-02-25 | Discharge: 2022-02-25 | Disposition: A | Discharge status: Home / Self Care | Payer: 59 | Source: Ambulatory Visit | Attending: Obstetrics & Gynecology | Admitting: Obstetrics & Gynecology

## 2022-02-25 ENCOUNTER — Other Ambulatory Visit (INDEPENDENT_AMBULATORY_CARE_PROVIDER_SITE_OTHER): Payer: 59

## 2022-02-25 DIAGNOSIS — Z113 Encounter for screening for infections with a predominantly sexual mode of transmission: Secondary | ICD-10-CM

## 2022-02-25 NOTE — Progress Notes (Signed)
? ?  NURSE VISIT- STD screen ? ?SUBJECTIVE:  ?Meghan Burton is a 26 y.o. G53P1001 GYN patient female here for a vaginal swab for STD screen.  She reports the following symptoms: none. "Just want to check everything" ?Denies abnormal vaginal bleeding, significant pelvic pain, fever, or UTI symptoms. ? ?OBJECTIVE:  ?There were no vitals taken for this visit.  ?Appears well, in no apparent distress ? ?ASSESSMENT: ?Vaginal swab for STD screen ? ?PLAN: ?Self-collected vaginal probe for Gonorrhea, Chlamydia, Trichomonas, Bacterial Vaginosis, Yeast sent to lab ?Treatment: to be determined once results are received ?Follow-up as needed if symptoms persist/worsen, or new symptoms develop ? ?Debbe Odea Tywon Niday  ?02/25/2022 ?3:38 PM ? ?

## 2022-02-27 LAB — CERVICOVAGINAL ANCILLARY ONLY
Bacterial Vaginitis (gardnerella): POSITIVE — AB
Candida Glabrata: NEGATIVE
Candida Vaginitis: POSITIVE — AB
Chlamydia: NEGATIVE
Comment: NEGATIVE
Comment: NEGATIVE
Comment: NEGATIVE
Comment: NEGATIVE
Comment: NEGATIVE
Comment: NORMAL
Neisseria Gonorrhea: NEGATIVE
Trichomonas: NEGATIVE

## 2022-02-28 ENCOUNTER — Other Ambulatory Visit: Payer: Self-pay | Admitting: Advanced Practice Midwife

## 2022-02-28 MED ORDER — METRONIDAZOLE 500 MG PO TABS: 500.0000 mg | ORAL_TABLET | Freq: Two times a day (BID) | ORAL | 0 refills | Status: DC

## 2022-02-28 MED ORDER — FLUCONAZOLE 150 MG PO TABS: 150.0000 mg | ORAL_TABLET | Freq: Once | ORAL | 0 refills | Status: AC

## 2022-04-13 IMAGING — DX DG FINGER THUMB 2+V*L*
3 series · 3 of 3 positions shown · non-contrast
Comparison: None.

CLINICAL DATA: Shut thumb in door yesterday, pain and bruising

EXAM:
LEFT THUMB 2+V

[thumb mlo]
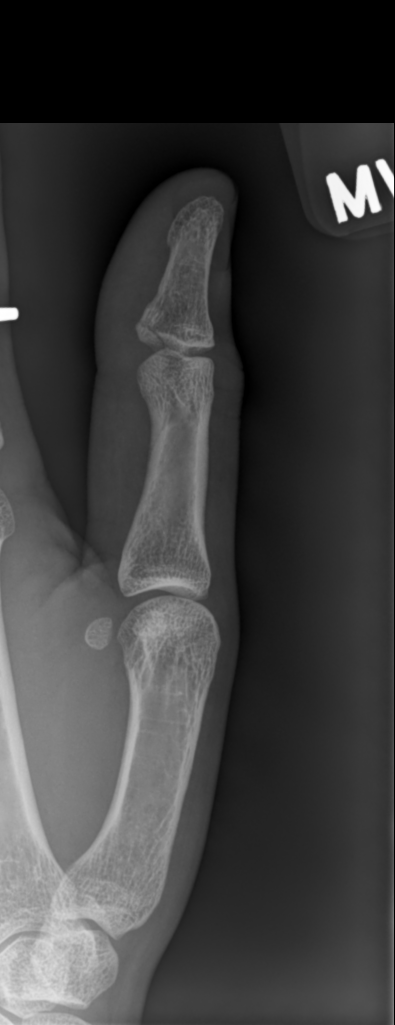

[thumb lat]
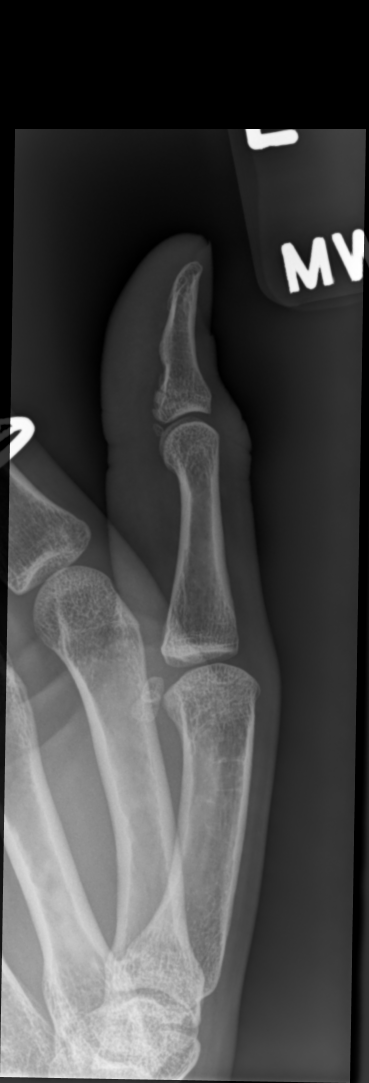

[thumb ap]
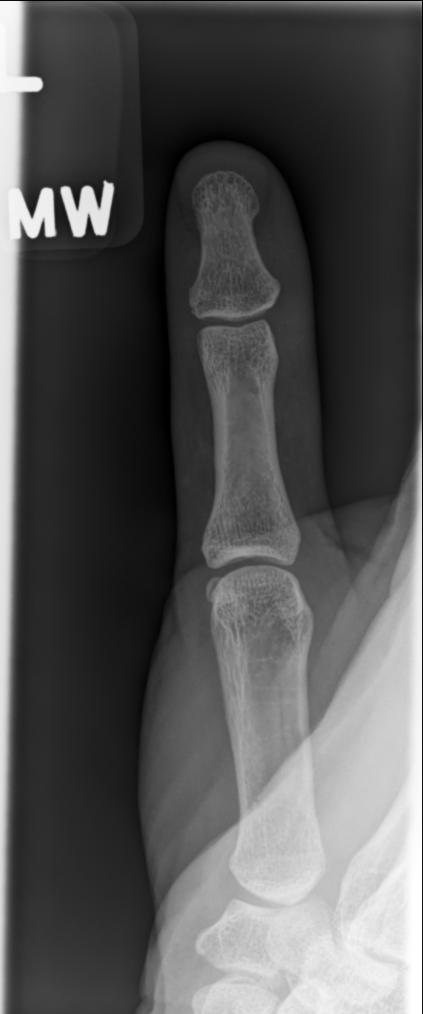

[3 of 3 positions shown; findings below may reference images not displayed]

FINDINGS: Frontal, oblique, lateral views of the left first digit are
obtained. There is a minimally displaced intra-articular fracture at
the ulnar aspect of the base of the first distal phalanx. No other
acute bony abnormalities. Joint spaces are well preserved. Diffuse
soft tissue edema.
IMPRESSION: 1. Minimally displaced intra-articular fracture at the ulnar aspect
of the base of the first distal phalanx.

## 2022-05-26 ENCOUNTER — Other Ambulatory Visit (HOSPITAL_COMMUNITY)
Admission: RE | Admit: 2022-05-26 | Discharge: 2022-05-26 | Disposition: A | Discharge status: Home / Self Care | Payer: 59 | Source: Ambulatory Visit | Attending: Obstetrics & Gynecology | Admitting: Obstetrics & Gynecology

## 2022-05-26 ENCOUNTER — Other Ambulatory Visit (INDEPENDENT_AMBULATORY_CARE_PROVIDER_SITE_OTHER): Payer: 59 | Admitting: *Deleted

## 2022-05-26 DIAGNOSIS — Z113 Encounter for screening for infections with a predominantly sexual mode of transmission: Secondary | ICD-10-CM | POA: Diagnosis not present

## 2022-05-26 NOTE — Progress Notes (Signed)
   NURSE VISIT- VAGINITIS/STD/POC  SUBJECTIVE:  Meghan Burton is a 26 y.o. G1P1001 GYN patientfemale here for a vaginal swab for STD screen.  She reports the following symptoms: none for 0 day. Denies abnormal vaginal bleeding, significant pelvic pain, fever, or UTI symptoms.  OBJECTIVE:  There were no vitals taken for this visit.  Appears well, in no apparent distress  ASSESSMENT: Vaginal swab for STD screen  PLAN: Self-collected vaginal probe for Gonorrhea, Chlamydia, Trichomonas, Bacterial Vaginosis, Yeast sent to lab Treatment: to be determined once results are received Follow-up as needed if symptoms persist/worsen, or new symptoms develop  Annamarie Dawley  05/26/2022 4:12 PM

## 2022-05-28 ENCOUNTER — Other Ambulatory Visit: Payer: Self-pay | Admitting: Adult Health

## 2022-05-28 ENCOUNTER — Other Ambulatory Visit: Payer: 59

## 2022-05-28 LAB — CERVICOVAGINAL ANCILLARY ONLY
Bacterial Vaginitis (gardnerella): POSITIVE — AB
Candida Glabrata: NEGATIVE
Candida Vaginitis: NEGATIVE
Chlamydia: NEGATIVE
Comment: NEGATIVE
Comment: NEGATIVE
Comment: NEGATIVE
Comment: NEGATIVE
Comment: NEGATIVE
Comment: NORMAL
Neisseria Gonorrhea: NEGATIVE
Trichomonas: NEGATIVE

## 2022-05-28 MED ORDER — METRONIDAZOLE 500 MG PO TABS: 500.0000 mg | ORAL_TABLET | Freq: Two times a day (BID) | ORAL | 0 refills | Status: DC

## 2022-05-28 NOTE — Progress Notes (Signed)
+  BV on vaginal swab will rx flagyl,no sex or alcohol while taking  ?

## 2022-06-15 ENCOUNTER — Ambulatory Visit
Admission: EM | Admit: 2022-06-15 | Discharge: 2022-06-15 | Disposition: A | Discharge status: Home / Self Care | Payer: 59

## 2022-06-15 DIAGNOSIS — J069 Acute upper respiratory infection, unspecified: Secondary | ICD-10-CM | POA: Diagnosis not present

## 2022-06-15 DIAGNOSIS — R110 Nausea: Secondary | ICD-10-CM

## 2022-06-15 MED ORDER — BENZONATATE 100 MG PO CAPS: 100.0000 mg | ORAL_CAPSULE | Freq: Three times a day (TID) | ORAL | 0 refills | Status: DC | PRN

## 2022-06-15 MED ORDER — IBUPROFEN 800 MG PO TABS: 800.0000 mg | ORAL_TABLET | Freq: Three times a day (TID) | ORAL | 0 refills | Status: DC | PRN

## 2022-06-15 MED ORDER — PROMETHAZINE-DM 6.25-15 MG/5ML PO SYRP: 5.0000 mL | ORAL_SOLUTION | Freq: Every evening | ORAL | 0 refills | Status: DC | PRN

## 2022-06-15 MED ORDER — ONDANSETRON 4 MG PO TBDP: 4.0000 mg | ORAL_TABLET | Freq: Three times a day (TID) | ORAL | 0 refills | Status: DC | PRN

## 2022-06-15 NOTE — ED Provider Notes (Addendum)
[facility]    CSN: [id number] Arrival date & time: 06/15/22  1503      History   Chief Complaint Chief Complaint  Patient presents with   Cough   Fever   Headache    HPI Meghan Burton is a 26 y.o. female.   Patient presents with 1 day of fever, body aches, chills, congested cough, chest and nasal congestion, runny nose, headache, ear pressure, nausea without vomiting, and fatigue.  Patient denies shortness of breath, wheezing, chest pain/tightness, sore throat, sinus pressure, swollen glands, ear drainage, diarrhea.  Reports she has taken Tylenol for her headache with minimal relief.  Patient denies history of chronic lung disease like asthma and allergies.    Past Medical History:  Diagnosis Date   Allergy    seasonal   Closed fracture of lateral portion of left tibial plateau 11/13/2014   Pregnancy induced hypertension    Tibial plateau fracture, left    Trichimoniasis 06/20/2021   06/20/21 treated    Vision abnormalities    glasses    Patient Active Problem List   Diagnosis Date Noted   Encounter for initial prescription of contraceptive pills 08/13/2021   Routine cervical smear 08/13/2021   Pregnancy examination or test, negative result 08/13/2021   Trichimoniasis 06/20/2021   Chlamydia 05/19/2021   Thrombosed external hemorrhoid 04/01/2021   Screening for deficiency anemia 04/01/2021   BMI 30.0-30.9,adult 03/25/2020    Past Surgical History:  Procedure Laterality Date   MYRINGOTOMY WITH TUBE PLACEMENT     ORIF TIBIA PLATEAU Left 11/13/2014   Procedure: OPEN REDUCTION INTERNAL FIXATION (ORIF) LEFT LATERAL TIBIAL PLATEAU;  Surgeon: Eulas Post, MD;  Location: [facility];  Service: Orthopedics;  Laterality: Left;    OB History     Gravida  1   Para  1   Term  1   Preterm      AB      Living  1      SAB      IAB      Ectopic      Multiple  0   Live Births  1            Home Medications    Prior  to Admission medications   Medication Sig Start Date End Date Taking? Authorizing Provider  acetaminophen (TYLENOL) 500 MG tablet Take 500 mg by mouth every 6 (six) hours as needed.   Yes [provider]  benzonatate (TESSALON) 100 MG capsule Take 1 capsule (100 mg total) by mouth 3 (three) times daily as needed for cough. Do not take with alcohol or while driving or operating heavy machinery 06/15/22  Yes Cathlean Marseilles A, NP  ibuprofen (ADVIL) 800 MG tablet Take 1 tablet (800 mg total) by mouth every 8 (eight) hours as needed for headache or fever. Take with food to prevent GI upset 06/15/22  Yes Cathlean Marseilles A, NP  ondansetron (ZOFRAN-ODT) 4 MG disintegrating tablet Take 1 tablet (4 mg total) by mouth every 8 (eight) hours as needed for nausea or vomiting. 06/15/22  Yes Valentino Nose, NP  promethazine-dextromethorphan (PROMETHAZINE-DM) 6.25-15 MG/5ML syrup Take 5 mLs by mouth at bedtime as needed for cough. Do not take with alcohol or while driving or operating heavy machinery 06/15/22  Yes Valentino Nose, NP  Norethindrone-Ethinyl Estradiol-Fe Biphas (LO LOESTRIN FE) 1 MG-10 MCG / 10 MCG tablet Take 1 tablet by mouth daily. Take 1 daily by mouth Patient not taking: Reported on  12/16/2021 08/13/21   [name], NP    Family History Family History  Problem Relation Age of Onset   Thyroid disease Maternal Grandmother    Hypertension Maternal Grandmother    Hypertension Mother     Social History Social History   Tobacco Use   Smoking status: Never   Smokeless tobacco: Never  Vaping Use   Vaping Use: Never used  Substance Use Topics   Alcohol use: No   Drug use: Never     Allergies   Patient has no known allergies.   Review of Systems Review of Systems Per HPI  Physical Exam Triage Vital Signs ED Triage Vitals  Enc Vitals Group     BP 06/15/22 1512 131/83     Pulse Rate 06/15/22 1512 81     Resp 06/15/22 1512 16     Temp 06/15/22 1512 99.5 F  (37.5 C)     Temp Source 06/15/22 1512 Oral     SpO2 06/15/22 1512 99 %     Weight --      Height --      Head Circumference --      Peak Flow --      Pain Score 06/15/22 1514 9     Pain Loc --      Pain Edu? --      Excl. in GC? --    No data found.  Updated Vital Signs BP 131/83 (BP Location: Right Arm)   Pulse 81   Temp 99.5 F (37.5 C) (Oral)   Resp 16   LMP 06/11/2022 (Exact Date)   SpO2 99%   Visual Acuity Right Eye Distance:   Left Eye Distance:   Bilateral Distance:    Right Eye Near:   Left Eye Near:    Bilateral Near:     Physical Exam Vitals and nursing note reviewed.  Constitutional:      General: She is not in acute distress.    Appearance: Normal appearance. She is not ill-appearing or toxic-appearing.  HENT:     Head: Normocephalic and atraumatic.     Right Ear: Tympanic membrane, ear canal and external ear normal. There is no impacted cerumen.     Left Ear: Tympanic membrane, ear canal and external ear normal. There is no impacted cerumen.     Nose: Congestion present. No rhinorrhea.     Right Sinus: No maxillary sinus tenderness or frontal sinus tenderness.     Left Sinus: No maxillary sinus tenderness or frontal sinus tenderness.     Mouth/Throat:     Mouth: Mucous membranes are moist.     Pharynx: Oropharynx is clear. Posterior oropharyngeal erythema present. No oropharyngeal exudate.  Cardiovascular:     Rate and Rhythm: Normal rate and regular rhythm.  Pulmonary:     Effort: Pulmonary effort is normal. No respiratory distress.     Breath sounds: Normal breath sounds. No wheezing, rhonchi or rales.  Abdominal:     General: Abdomen is flat. Bowel sounds are normal. There is no distension.     Palpations: Abdomen is soft.  Musculoskeletal:     Cervical back: Normal range of motion and neck supple.  Lymphadenopathy:     Cervical: No cervical adenopathy.  Skin:    General: Skin is warm and dry.     Coloration: Skin is not jaundiced or  pale.     Findings: No erythema or rash.  Neurological:     Mental Status: She is alert and oriented to  person, place, and time.  Psychiatric:        Behavior: Behavior is cooperative.      UC Treatments / Results  Labs (all labs ordered are listed, but only abnormal results are displayed) Labs Reviewed  COVID-19, FLU A+B NAA    EKG   Radiology No results found.  Procedures Procedures (including critical care time)  Medications Ordered in UC Medications - No data to display  Initial Impression / Assessment and Plan / UC Course  I have reviewed the triage vital signs and the nursing notes.  Pertinent labs & imaging results that were available during my care of the patient were reviewed by me and considered in my medical decision making (see chart for details).    Patient is a well-appearing, very pleasant 26 year old female presenting for fever, cough, congestion.  COVID-19 and influenza testing obtained.  Reassured patient that symptoms and exam findings are most consistent with a viral upper respiratory infection and explained lack of efficacy of antibiotics against viruses.  Discussed expected course and features suggestive of secondary bacterial infection.  Continue supportive care. Increase fluid intake with water or electrolyte solution like pedialyte. Encouraged acetaminophen as needed for fever/pain. Encouraged salt water gargling, chloraseptic spray and throat lozenges. Encouraged OTC guaifenesin. Encouraged saline sinus flushes and/or neti with humidified air.  Can use cough suppressants as needed for dry cough, ondansetron 4 mg under the tongue every 8 hours as needed for nausea, ibuprofen as needed for headache.  ER precautions discussed.  Return here if symptoms persist or worsen more than 10 days without improvement.  Final Clinical Impressions(s) / UC Diagnoses   Final diagnoses:  Viral URI with cough  Nausea without vomiting     Discharge Instructions       Your symptoms and exam findings are most consistent with a viral upper respiratory infection. These usually run their course in about 10 days.  If your symptoms last longer than 10 days without improvement, please follow up with your primary care provider.  If your symptoms, worsen, please go to the Emergency Room.    We have tested you today for COVID-19 and influenza.  You will see the results in Mychart and we will call you with positive results.    Please stay home and isolate until you are aware of the results.    Some things that can make you feel better are: - Increased rest - Increasing fluid with water/sugar free electrolytes - Acetaminophen and ibuprofen as needed for fever/pain.  - Salt water gargling, chloraseptic spray and throat lozenges - OTC guaifenesin (Mucinex).  - Saline sinus flushes or a neti pot.  - Humidifying the air. -Tessalon Perles every 8 hours during the day as needed for cough as long as they do not make you sleepy -Cough syrup at nighttime    ED Prescriptions     Medication Sig Dispense Auth. Provider   benzonatate (TESSALON) 100 MG capsule Take 1 capsule (100 mg total) by mouth 3 (three) times daily as needed for cough. Do not take with alcohol or while driving or operating heavy machinery 21 capsule Cathlean Marseilles A, NP   promethazine-dextromethorphan (PROMETHAZINE-DM) 6.25-15 MG/5ML syrup Take 5 mLs by mouth at bedtime as needed for cough. Do not take with alcohol or while driving or operating heavy machinery 118 mL Cathlean Marseilles A, NP   ondansetron (ZOFRAN-ODT) 4 MG disintegrating tablet Take 1 tablet (4 mg total) by mouth every 8 (eight) hours as needed for nausea or vomiting.  20 tablet Cathlean Marseilles A, NP   ibuprofen (ADVIL) 800 MG tablet Take 1 tablet (800 mg total) by mouth every 8 (eight) hours as needed for headache or fever. Take with food to prevent GI upset 21 tablet Valentino Nose, NP      PDMP not reviewed this encounter.    Valentino Nose, NP 06/15/22 1536    Valentino Nose, NP 06/15/22 1538

## 2022-06-15 NOTE — Discharge Instructions (Addendum)
Your symptoms and exam findings are most consistent with a viral upper respiratory infection. These usually run their course in about 10 days.  If your symptoms last longer than 10 days without improvement, please follow up with your primary care provider.  If your symptoms, worsen, please go to the Emergency Room.    We have tested you today for COVID-19 and influenza.  You will see the results in Mychart and we will call you with positive results.    Please stay home and isolate until you are aware of the results.    Some things that can make you feel better are: - Increased rest - Increasing fluid with water/sugar free electrolytes - Acetaminophen and ibuprofen as needed for fever/pain.  - Salt water gargling, chloraseptic spray and throat lozenges - OTC guaifenesin (Mucinex).  - Saline sinus flushes or a neti pot.  - Humidifying the air. -Tessalon Perles every 8 hours during the day as needed for cough as long as they do not make you sleepy -Cough syrup at nighttime

## 2022-06-15 NOTE — ED Triage Notes (Signed)
Pt reports fever, nasal congestion, cough and headache x 1 day. Tylenol gives no relief.

## 2022-06-16 LAB — COVID-19, FLU A+B NAA
Influenza A, NAA: NOT DETECTED
Influenza B, NAA: NOT DETECTED
SARS-CoV-2, NAA: DETECTED — AB

## 2022-07-20 ENCOUNTER — Other Ambulatory Visit (HOSPITAL_COMMUNITY)
Admission: RE | Admit: 2022-07-20 | Discharge: 2022-07-20 | Disposition: A | Discharge status: Home / Self Care | Payer: 59 | Source: Ambulatory Visit | Attending: Adult Health | Admitting: Adult Health

## 2022-07-20 ENCOUNTER — Ambulatory Visit (INDEPENDENT_AMBULATORY_CARE_PROVIDER_SITE_OTHER): Payer: 59 | Admitting: Adult Health

## 2022-07-20 ENCOUNTER — Encounter: Payer: Self-pay | Admitting: Adult Health

## 2022-07-20 VITALS — BP 117/69 | HR 75 | Ht 67.0 in | Wt 199.5 lb

## 2022-07-20 DIAGNOSIS — Z3202 Encounter for pregnancy test, result negative: Secondary | ICD-10-CM

## 2022-07-20 DIAGNOSIS — Z3009 Encounter for other general counseling and advice on contraception: Secondary | ICD-10-CM | POA: Diagnosis not present

## 2022-07-20 DIAGNOSIS — Z113 Encounter for screening for infections with a predominantly sexual mode of transmission: Secondary | ICD-10-CM | POA: Diagnosis not present

## 2022-07-20 LAB — POCT URINE PREGNANCY: Preg Test, Ur: NEGATIVE

## 2022-07-20 NOTE — Progress Notes (Signed)
  Subjective:     Patient ID: Meghan Burton, female   DOB: Oct 13, 1996, 26 y.o.   MRN: [id number]  HPI Loeta is a 26 year old black female,single, G1P1, in to do self swab for STD screening and discuss birth control options, she is thinking IUD. She has had nexplnaon before. Last sex was 07/18/22.  Last pap was 08/23/21 negative for malignancy and GC/CHL.  PCP is S Early NP.  Review of Systems Denies any discharge, itching or burning Reviewed past medical,surgical, social and family history. Reviewed medications and allergies.     Objective:   Physical Exam BP 117/69 (BP Location: Left Arm, Patient Position: Sitting, Cuff Size: Normal)   Pulse 75   Ht 5\' 7"  (1.702 m)   Wt 199 lb 8 oz (90.5 kg)   LMP 07/10/2022   BMI 31.25 kg/m  UPT is negative.Skin warm and dry.  Lungs: clear to ausculation bilaterally. Cardiovascular: regular rate and rhythm.    She did self swab.  [facility] - 07/20/22 1502       Pregnancy Intention Screening   Does the patient want to become pregnant in the next year? No    Does the patient's partner want to become pregnant in the next year? No    Would the patient like to discuss contraceptive options today? Yes      Contraception Wrap Up   Current Method Female Condom    End Method Female Condom   will get IUD in 2 weeks   Contraception Counseling Provided Yes             Assessment:     1. Screening examination for STD (sexually transmitted disease) CV swab sent for GC/CHL,trich,BV and yeast  - Cervicovaginal ancillary only( [place])  2. Pregnancy examination or test, negative result - POCT urine pregnancy  3. General counseling and advice for contraceptive management Discussed mirena IUD, depo and nexplanon and she wants IUD Handout given on mirena No sex, return in 2 weeks for IUD insertion    Plan:     No sex Return in 2 weeks for IUD insertion, should be on period then too.

## 2022-07-22 ENCOUNTER — Other Ambulatory Visit: Payer: Self-pay | Admitting: Adult Health

## 2022-07-22 LAB — CERVICOVAGINAL ANCILLARY ONLY
Bacterial Vaginitis (gardnerella): NEGATIVE
Candida Glabrata: NEGATIVE
Candida Vaginitis: POSITIVE — AB
Chlamydia: NEGATIVE
Comment: NEGATIVE
Comment: NEGATIVE
Comment: NEGATIVE
Comment: NEGATIVE
Comment: NEGATIVE
Comment: NORMAL
Neisseria Gonorrhea: NEGATIVE
Trichomonas: NEGATIVE

## 2022-07-22 MED ORDER — FLUCONAZOLE 150 MG PO TABS: ORAL_TABLET | ORAL | 1 refills | Status: DC

## 2022-07-22 NOTE — Progress Notes (Signed)
+  yeast on vaginal swab will rx diflucan  

## 2022-08-04 ENCOUNTER — Ambulatory Visit: Payer: 59 | Admitting: Adult Health

## 2022-12-17 ENCOUNTER — Other Ambulatory Visit (INDEPENDENT_AMBULATORY_CARE_PROVIDER_SITE_OTHER): Payer: 59 | Admitting: *Deleted

## 2022-12-17 ENCOUNTER — Other Ambulatory Visit (HOSPITAL_COMMUNITY)
Admission: RE | Admit: 2022-12-17 | Discharge: 2022-12-17 | Disposition: A | Discharge status: Home / Self Care | Payer: 59 | Source: Ambulatory Visit | Attending: Obstetrics & Gynecology | Admitting: Obstetrics & Gynecology

## 2022-12-17 DIAGNOSIS — Z113 Encounter for screening for infections with a predominantly sexual mode of transmission: Secondary | ICD-10-CM

## 2022-12-17 NOTE — Progress Notes (Signed)
   NURSE VISIT- VAGINITIS/STD/POC  SUBJECTIVE:  Meghan Burton is a 27 y.o. G1P1001 GYN patientfemale here for a vaginal swab for STD screen.  She reports the following symptoms: none for 0 days. Denies abnormal vaginal bleeding, significant pelvic pain, fever, or UTI symptoms.  OBJECTIVE:  There were no vitals taken for this visit.  Appears well, in no apparent distress  ASSESSMENT: Vaginal swab for STD screen  PLAN: Self-collected vaginal probe for Gonorrhea, Chlamydia, Trichomonas, Bacterial Vaginosis, Yeast sent to lab Treatment: to be determined once results are received Follow-up as needed if symptoms persist/worsen, or new symptoms develop  Janece Canterbury  12/17/2022 3:47 PM

## 2022-12-21 ENCOUNTER — Other Ambulatory Visit: Payer: Self-pay | Admitting: Adult Health

## 2022-12-21 LAB — CERVICOVAGINAL ANCILLARY ONLY
Bacterial Vaginitis (gardnerella): POSITIVE — AB
Candida Glabrata: NEGATIVE
Candida Vaginitis: POSITIVE — AB
Chlamydia: NEGATIVE
Comment: NEGATIVE
Comment: NEGATIVE
Comment: NEGATIVE
Comment: NEGATIVE
Comment: NEGATIVE
Comment: NORMAL
Neisseria Gonorrhea: NEGATIVE
Trichomonas: NEGATIVE

## 2022-12-21 MED ORDER — FLUCONAZOLE 150 MG PO TABS: ORAL_TABLET | ORAL | 1 refills | Status: DC

## 2022-12-21 MED ORDER — METRONIDAZOLE 500 MG PO TABS: 500.0000 mg | ORAL_TABLET | Freq: Two times a day (BID) | ORAL | 0 refills | Status: DC

## 2022-12-21 NOTE — Progress Notes (Signed)
+  BV and yeast on vaginal swab, will rx flagyl and diflucan no sex or alcohol while taking

## 2023-01-07 ENCOUNTER — Encounter: Payer: Self-pay | Admitting: Radiology

## 2023-02-16 ENCOUNTER — Ambulatory Visit
Admission: EM | Admit: 2023-02-16 | Discharge: 2023-02-16 | Disposition: A | Discharge status: Home / Self Care | Payer: 59 | Attending: Nurse Practitioner | Admitting: Nurse Practitioner

## 2023-02-16 DIAGNOSIS — Z202 Contact with and (suspected) exposure to infections with a predominantly sexual mode of transmission: Secondary | ICD-10-CM

## 2023-02-16 DIAGNOSIS — Z113 Encounter for screening for infections with a predominantly sexual mode of transmission: Secondary | ICD-10-CM | POA: Insufficient documentation

## 2023-02-16 NOTE — Discharge Instructions (Signed)
We are testing you today for gonorrhea, chlamydia, trichomonas, yeast infection, BV, HIV and syphilis.  Will call you if any of the testing comes back positive.  If you develop any vaginal sores that are painful or rashes, please return for more testing.  In the meantime, recommend using condoms with every sexual encounter.

## 2023-02-16 NOTE — ED Provider Notes (Signed)
[facility]    CSN: [id number] Arrival date & time: 02/16/23  1736      History   Chief Complaint Chief Complaint  Patient presents with   Exposure to STD    Testing - Entered by patient    HPI Meghan Burton is a 27 y.o. female.   Patient presents today for STD testing.  Denies known exposures to STDs or current symptoms including no dysuria, urinary frequency or urgency, abdominal pain, pelvic pain, groin swelling, fever, nausea/vomiting.  No vaginal sores, rashes, or lesions.  Reports a sexual partner has told her it may have been exposed or may have HSV and she became concerned.  She is requesting all testing for STDs today.    Past Medical History:  Diagnosis Date   Allergy    seasonal   Closed fracture of lateral portion of left tibial plateau 11/13/2014   Pregnancy induced hypertension    Tibial plateau fracture, left    Trichimoniasis 06/20/2021   06/20/21 treated    Vision abnormalities    glasses    Patient Active Problem List   Diagnosis Date Noted   General counseling and advice for contraceptive management 07/20/2022   Screening examination for STD (sexually transmitted disease) 07/20/2022   Encounter for initial prescription of contraceptive pills 08/13/2021   Routine cervical smear 08/13/2021   Pregnancy examination or test, negative result 08/13/2021   Trichimoniasis 06/20/2021   Chlamydia 05/19/2021   Thrombosed external hemorrhoid 04/01/2021   Screening for deficiency anemia 04/01/2021   BMI 30.0-30.9,adult 03/25/2020    Past Surgical History:  Procedure Laterality Date   MYRINGOTOMY WITH TUBE PLACEMENT     ORIF TIBIA PLATEAU Left 11/13/2014   Procedure: OPEN REDUCTION INTERNAL FIXATION (ORIF) LEFT LATERAL TIBIAL PLATEAU;  Surgeon: Eulas Post, MD;  Location: [facility];  Service: Orthopedics;  Laterality: Left;    OB History     Gravida  1   Para  1   Term  1   Preterm      AB      Living  1       SAB      IAB      Ectopic      Multiple  0   Live Births  1            Home Medications    Prior to Admission medications   Medication Sig Start Date End Date Taking? Authorizing Provider  metroNIDAZOLE (FLAGYL) 500 MG tablet Take 1 tablet (500 mg total) by mouth 2 (two) times daily. 12/21/22   [name], NP  acetaminophen (TYLENOL) 500 MG tablet Take 500 mg by mouth every 6 (six) hours as needed.    [provider]  fluconazole (DIFLUCAN) 150 MG tablet Take 1 now and 1 in 3 days 12/21/22   [name], NP    Family History Family History  Problem Relation Age of Onset   Thyroid disease Maternal Grandmother    Hypertension Maternal Grandmother    Hypertension Mother     Social History Social History   Tobacco Use   Smoking status: Never   Smokeless tobacco: Never  Vaping Use   Vaping Use: Never used  Substance Use Topics   Alcohol use: No   Drug use: Never     Allergies   Patient has no known allergies.   Review of Systems Review of Systems Per HPI  Physical Exam Triage Vital Signs ED Triage Vitals [02/16/23 1810]  Enc Vitals Group     BP 127/74     Pulse Rate 93     Resp 15     Temp 98.5 F (36.9 C)     Temp Source Oral     SpO2 99 %     Weight      Height      Head Circumference      Peak Flow      Pain Score 0     Pain Loc      Pain Edu?      Excl. in GC?    No data found.  Updated Vital Signs BP 127/74 (BP Location: Right Arm)   Pulse 93   Temp 98.5 F (36.9 C) (Oral)   Resp 15   LMP 01/18/2023 (Within Days)   SpO2 99%   Visual Acuity Right Eye Distance:   Left Eye Distance:   Bilateral Distance:    Right Eye Near:   Left Eye Near:    Bilateral Near:     Physical Exam Vitals and nursing note reviewed.  Constitutional:      General: She is not in acute distress.    Appearance: Normal appearance. She is not toxic-appearing.  Pulmonary:     Effort: Pulmonary effort is normal. No respiratory  distress.  Genitourinary:    Comments: Deferred-self swab obtained Skin:    General: Skin is warm and dry.     Coloration: Skin is not jaundiced or pale.     Findings: No erythema.  Neurological:     Mental Status: She is alert and oriented to person, place, and time.     Motor: No weakness.     Gait: Gait normal.  Psychiatric:        Mood and Affect: Mood normal.        Behavior: Behavior is cooperative.      UC Treatments / Results  Labs (all labs ordered are listed, but only abnormal results are displayed) Labs Reviewed  RPR  HIV ANTIBODY (ROUTINE TESTING W REFLEX)  CERVICOVAGINAL ANCILLARY ONLY    EKG   Radiology No results found.  Procedures Procedures (including critical care time)  Medications Ordered in UC Medications - No data to display  Initial Impression / Assessment and Plan / UC Course  I have reviewed the triage vital signs and the nursing notes.  Pertinent labs & imaging results that were available during my care of the patient were reviewed by me and considered in my medical decision making (see chart for details).   Patient is well-appearing, normotensive, afebrile, not tachycardic, not tachypneic, oxygenating well on room air.    1. Screening for STD (sexually transmitted disease) Self swab obtained to check for chlamydia, gonorrhea, trichomonas, yeast infection, BV. Will also test for HIV and syphilis per patient's request Given no vaginal sores or lesions, HSV testing deferred and had lengthy conversation with patient Recommended condom use with every sexual encounter moving forward  The patient was given the opportunity to ask questions.  All questions answered to their satisfaction.  The patient is in agreement to this plan.    Final Clinical Impressions(s) / UC Diagnoses   Final diagnoses:  Screening for STD (sexually transmitted disease)     Discharge Instructions      We are testing you today for gonorrhea, chlamydia,  trichomonas, yeast infection, BV, HIV and syphilis.  Will call you if any of the testing comes back positive.  If you develop any vaginal sores that are  painful or rashes, please return for more testing.  In the meantime, recommend using condoms with every sexual encounter.   ED Prescriptions   None    PDMP not reviewed this encounter.   Valentino Nose, NP 02/16/23 [phone]

## 2023-02-16 NOTE — ED Triage Notes (Signed)
Pt c/o STD testing pt is unaware of if she has been exposed to STD's, pt has 1 sexual partner uses no protection.   Denies sx's

## 2023-02-17 ENCOUNTER — Telehealth: Payer: Self-pay | Admitting: Emergency Medicine

## 2023-02-17 LAB — CERVICOVAGINAL ANCILLARY ONLY
Bacterial Vaginitis (gardnerella): POSITIVE — AB
Candida Glabrata: NEGATIVE
Candida Vaginitis: NEGATIVE
Chlamydia: NEGATIVE
Comment: NEGATIVE
Comment: NEGATIVE
Comment: NEGATIVE
Comment: NEGATIVE
Comment: NEGATIVE
Comment: NORMAL
Neisseria Gonorrhea: NEGATIVE
Trichomonas: NEGATIVE

## 2023-02-17 LAB — RPR: RPR Ser Ql: NONREACTIVE

## 2023-02-17 LAB — HIV ANTIBODY (ROUTINE TESTING W REFLEX): HIV Screen 4th Generation wRfx: NONREACTIVE

## 2023-02-17 MED ORDER — METRONIDAZOLE 500 MG PO TABS: 500.0000 mg | ORAL_TABLET | Freq: Two times a day (BID) | ORAL | 0 refills | Status: DC

## 2023-04-27 ENCOUNTER — Ambulatory Visit: Payer: 59 | Admitting: Adult Health

## 2023-05-03 ENCOUNTER — Ambulatory Visit: Payer: 59 | Admitting: Adult Health

## 2023-05-18 ENCOUNTER — Institutional Professional Consult (permissible substitution): Payer: 59 | Admitting: Plastic Surgery

## 2023-06-28 ENCOUNTER — Ambulatory Visit: Payer: 59 | Admitting: Family Medicine

## 2023-06-29 ENCOUNTER — Encounter: Payer: Self-pay | Admitting: Nurse Practitioner

## 2023-07-29 ENCOUNTER — Ambulatory Visit (INDEPENDENT_AMBULATORY_CARE_PROVIDER_SITE_OTHER): Payer: 59 | Admitting: Family Medicine

## 2023-07-29 ENCOUNTER — Encounter: Payer: Self-pay | Admitting: Family Medicine

## 2023-07-29 VITALS — BP 119/83 | HR 65 | Ht 67.0 in | Wt 229.6 lb

## 2023-07-29 DIAGNOSIS — Z0001 Encounter for general adult medical examination with abnormal findings: Secondary | ICD-10-CM | POA: Diagnosis not present

## 2023-07-29 DIAGNOSIS — Z13220 Encounter for screening for lipoid disorders: Secondary | ICD-10-CM

## 2023-07-29 DIAGNOSIS — Z131 Encounter for screening for diabetes mellitus: Secondary | ICD-10-CM

## 2023-07-29 DIAGNOSIS — Z1329 Encounter for screening for other suspected endocrine disorder: Secondary | ICD-10-CM

## 2023-07-29 DIAGNOSIS — E559 Vitamin D deficiency, unspecified: Secondary | ICD-10-CM | POA: Diagnosis not present

## 2023-07-29 DIAGNOSIS — L309 Dermatitis, unspecified: Secondary | ICD-10-CM

## 2023-07-29 MED ORDER — TRIAMCINOLONE ACETONIDE 0.1 % EX CREA: 1.0000 | TOPICAL_CREAM | Freq: Two times a day (BID) | CUTANEOUS | 0 refills | Status: DC

## 2023-07-29 MED ORDER — CEPHALEXIN 500 MG PO CAPS: 500.0000 mg | ORAL_CAPSULE | Freq: Two times a day (BID) | ORAL | 0 refills | Status: AC

## 2023-07-29 NOTE — Progress Notes (Signed)
Complete physical exam  Patient: Meghan Burton   DOB: 31-Jul-1996   27 y.o. Female  MRN: [id number]  Subjective:    Chief Complaint  Patient presents with   Annual Exam    Pt. Reports she has eczema and reports she has two infected toes. Reports they are two big toes, went to the nail salon. Overall reports general good health. Reports getting 8 hours of sleep. Neg. Drug, tobacco use/abuse. She would like to get an annual physical today. Has not fasted today.     Meghan Burton is a 27 y.o. female who presents today for a complete physical exam. She reports consuming a general diet.  Walks twice a week about 3-5 miles  She generally feels well. She reports sleeping fairly well. She does have additional problems to discuss today.    Most recent fall risk assessment:    07/29/2023   11:06 AM  Fall Risk   Falls in the past year? 0     Most recent depression screenings:    07/29/2023   11:06 AM 04/01/2021    3:04 PM  PHQ 2/9 Scores  PHQ - 2 Score 0 0  PHQ- 9 Score 1 0    Vision:Not within last year  and Dental: No current dental problems and No regular dental care   Patient Care Team: Del Newman Nip, Tenna Child, FNP as PCP - General (Family Medicine)   Outpatient Medications Prior to Visit  Medication Sig   acetaminophen (TYLENOL) 500 MG tablet Take 500 mg by mouth every 6 (six) hours as needed. (Patient not taking: Reported on 07/29/2023)   fluconazole (DIFLUCAN) 150 MG tablet Take 1 now and 1 in 3 days (Patient not taking: Reported on 07/29/2023)   metroNIDAZOLE (FLAGYL) 500 MG tablet Take 1 tablet (500 mg total) by mouth 2 (two) times daily. (Patient not taking: Reported on 07/29/2023)   No facility-administered medications prior to visit.    Review of Systems  Constitutional:  Negative for chills and fever.  Eyes:  Negative for blurred vision.  Respiratory:  Negative for shortness of breath.   Cardiovascular:  Negative for chest pain.  Gastrointestinal:  Negative for  abdominal pain.  Genitourinary:  Negative for dysuria.  Musculoskeletal:  Negative for myalgias.  Skin:  Positive for itching and rash.  Neurological:  Negative for dizziness and headaches.  Psychiatric/Behavioral:  Negative for depression.        Objective:    BP 119/83   Pulse 65   Ht 5\' 7"  (1.702 m)   Wt 229 lb 9.6 oz (104.1 kg)   LMP 07/17/2023   SpO2 99%   BMI 35.96 kg/m  BP Readings from Last 3 Encounters:  07/29/23 119/83  02/16/23 127/74  07/20/22 117/69      Physical Exam Vitals reviewed.  Constitutional:      General: She is not in acute distress.    Appearance: Normal appearance. She is not ill-appearing, toxic-appearing or diaphoretic.  HENT:     Head: Normocephalic.     Right Ear: Tympanic membrane normal.     Left Ear: Tympanic membrane normal.     Nose: Nose normal.     Mouth/Throat:     Mouth: Mucous membranes are moist.  Eyes:     General:        Right eye: No discharge.        Left eye: No discharge.     Conjunctiva/sclera: Conjunctivae normal.     Pupils: Pupils are equal, round, and reactive  to light.  Cardiovascular:     Rate and Rhythm: Normal rate.     Pulses: Normal pulses.     Heart sounds: Normal heart sounds.  Pulmonary:     Effort: Pulmonary effort is normal. No respiratory distress.     Breath sounds: Normal breath sounds.  Abdominal:     General: Bowel sounds are normal.     Palpations: Abdomen is soft.     Tenderness: There is no abdominal tenderness. There is no right CVA tenderness, left CVA tenderness or guarding.  Musculoskeletal:        General: Normal range of motion.     Cervical back: Normal range of motion.  Skin:    General: Skin is warm and dry.     Capillary Refill: Capillary refill takes less than 2 seconds.  Neurological:     General: No focal deficit present.     Mental Status: She is alert.     Coordination: Coordination normal.     Gait: Gait normal.  Psychiatric:        Mood and Affect: Mood normal.       No results found for any visits on 07/29/23.    Assessment & Plan:    Routine Health Maintenance and Physical Exam  Immunization History  Administered Date(s) Administered   DTaP 01/29/1997, 04/04/1997, 06/13/1997, 03/28/1998, 02/10/2002   HIB (PRP-OMP) 01/29/1997, 03/28/1997, 04/04/1997, 06/13/1997   HPV Quadrivalent 04/26/2007, 07/05/2007, 03/09/2008   Hepatitis A, Ped/Adol-2 Dose 04/26/2007, 03/09/2008   Hepatitis B, PED/ADOLESCENT 04-24-1997, 01/29/1997, 06/13/1997   IPV 01/29/1997, 04/04/1997, 03/28/1998, 02/10/2002   Influenza Nasal 01/04/2012, 12/20/2012   Influenza,inj,Quad PF,6+ Mos 06/15/2017, 06/17/2018   Influenza-Unspecified 09/07/2021   MMR 12/13/1997, 02/10/2002   Meningococcal Conjugate 07/03/2008   Meningococcal polysaccharide vaccine (MPSV4) 01/10/2015   Td 07/03/2008   Tdap 07/03/2008, 11/11/2018   Varicella 12/13/1997, 04/26/2007    Health Maintenance  Topic Date Due   INFLUENZA VACCINE  06/10/2023   COVID-19 Vaccine (1 - 2023-24 season) Never done   Cervical Cancer Screening (Pap smear)  08/13/2024   DTaP/Tdap/Td (9 - Td or Tdap) 11/11/2028   HPV VACCINES  Completed   Hepatitis C Screening  Completed   HIV Screening  Completed    Discussed health benefits of physical activity, and encouraged her to engage in regular exercise appropriate for her age and condition.  Screening for diabetes mellitus -     Microalbumin / creatinine urine ratio -     Hemoglobin A1c  Vitamin D deficiency -     VITAMIN D 25 Hydroxy (Vit-D Deficiency, Fractures)  Screening for thyroid disorder -     TSH + free T4  Screening for lipid disorders -     Lipid panel -     CMP14+EGFR -     CBC with Differential/Platelet  Encounter for routine adult physical exam with abnormal findings Assessment & Plan: Physical exam done, labs ordered  Updated screening and health maintenance  Exercise and nutrition counseling BMI  35.96   Eczema, unspecified  type Assessment & Plan: Kenalog 0.1% twice daily   Other orders -     Triamcinolone Acetonide; Apply 1 Application topically 2 (two) times daily.  Dispense: 30 g; Refill: 0 -     Cephalexin; Take 1 capsule (500 mg total) by mouth 2 (two) times daily for 5 days.  Dispense: 10 capsule; Refill: 0    Return in about 1 year (around 07/28/2024), or if symptoms worsen or fail to improve, for Annual Physical.  Cruzita Lederer Newman Nip, FNP

## 2023-07-29 NOTE — Progress Notes (Deleted)
New Patient Office Visit   Subjective   Patient ID: Tennille Sanchez, female    DOB: 05-19-1996  Age: 27 y.o. MRN: [id number]  CC:  Chief Complaint  Patient presents with   Annual Exam    Pt. Reports she has eczema and reports she has two infected toes. Reports they are two big toes, went to the nail salon. Overall reports general good health. Reports getting 8 hours of sleep. Neg. Drug, tobacco use/abuse. She would like to get an annual physical today. Has not fasted today.     HPI Amoreena Stene presents to establish care. She  has a past medical history of Allergy, Closed fracture of lateral portion of left tibial plateau (11/13/2014), Pregnancy induced hypertension, Tibial plateau fracture, left, Trichimoniasis (06/20/2021), and Vision abnormalities.  HPI  ***  Outpatient Encounter Medications as of 07/29/2023  Medication Sig   acetaminophen (TYLENOL) 500 MG tablet Take 500 mg by mouth every 6 (six) hours as needed. (Patient not taking: Reported on 07/29/2023)   fluconazole (DIFLUCAN) 150 MG tablet Take 1 now and 1 in 3 days (Patient not taking: Reported on 07/29/2023)   metroNIDAZOLE (FLAGYL) 500 MG tablet Take 1 tablet (500 mg total) by mouth 2 (two) times daily. (Patient not taking: Reported on 07/29/2023)   No facility-administered encounter medications on file as of 07/29/2023.    Past Surgical History:  Procedure Laterality Date   MYRINGOTOMY WITH TUBE PLACEMENT     ORIF TIBIA PLATEAU Left 11/13/2014   Procedure: OPEN REDUCTION INTERNAL FIXATION (ORIF) LEFT LATERAL TIBIAL PLATEAU;  Surgeon: Eulas Post, MD;  Location: [facility];  Service: Orthopedics;  Laterality: Left;    ROS    Objective    Pulse 65   Ht 5\' 7"  (1.702 m)   Wt 229 lb 9.6 oz (104.1 kg)   LMP 07/17/2023   SpO2 99%   BMI 35.96 kg/m   Physical Exam    Assessment & Plan:  There are no diagnoses linked to this encounter.  No follow-ups on file.   Cruzita Lederer Newman Nip, FNP

## 2023-07-29 NOTE — Patient Instructions (Signed)

## 2023-07-29 NOTE — Assessment & Plan Note (Signed)
Physical exam done, labs ordered  Updated screening and health maintenance  Exercise and nutrition counseling BMI  35.96

## 2023-07-29 NOTE — Assessment & Plan Note (Signed)
Kenalog 0.1% twice daily

## 2023-09-01 ENCOUNTER — Encounter: Payer: Self-pay | Admitting: Podiatry

## 2023-09-01 ENCOUNTER — Ambulatory Visit (INDEPENDENT_AMBULATORY_CARE_PROVIDER_SITE_OTHER): Payer: 59 | Admitting: Podiatry

## 2023-09-01 DIAGNOSIS — B351 Tinea unguium: Secondary | ICD-10-CM

## 2023-09-01 DIAGNOSIS — L609 Nail disorder, unspecified: Secondary | ICD-10-CM | POA: Diagnosis not present

## 2023-09-01 DIAGNOSIS — L84 Corns and callosities: Secondary | ICD-10-CM

## 2023-09-01 DIAGNOSIS — M205X2 Other deformities of toe(s) (acquired), left foot: Secondary | ICD-10-CM

## 2023-09-01 NOTE — Addendum Note (Signed)
Addended by: Daryel November on: 09/01/2023 01:20 PM   Modules accepted: Orders

## 2023-09-01 NOTE — Progress Notes (Signed)
  Subjective:  Patient ID: Meghan Burton, female    DOB: 1996/09/09,   MRN: [id number]  No chief complaint on file.   27 y.o. female presents for concerns of spots on toenails and possible infection. Relates she had acrylic nail polish on and when took it off the nail was discolored so wanted to have it checked out. Denies treatment. Also relates callus on fifth toe and medial arch that will occasionally hurt.   . Denies any other pedal complaints. Denies n/v/f/c.   Past Medical History:  Diagnosis Date   Allergy    seasonal   Closed fracture of lateral portion of left tibial plateau 11/13/2014   Pregnancy induced hypertension    Tibial plateau fracture, left    Trichimoniasis 06/20/2021   06/20/21 treated    Vision abnormalities    glasses    Objective:  Physical Exam: Vascular: DP/PT pulses 2/4 bilateral. CFT <3 seconds. Normal hair growth on digits. No edema.  Skin. No lacerations or abrasions bilateral feet. Bilateral hallux nails with thickened and discoloration distally. Left fifth digit corn noted dorsally and medial arch hyperkeratotic lesion with possible foreign body present Musculoskeletal: MMT 5/5 bilateral lower extremities in DF, PF, Inversion and Eversion. Deceased ROM in DF of ankle joint.  Neurological: Sensation intact to light touch.   Assessment:   1. Onychomycosis   2. Adductovarus rotation of toe, acquired, left   3. Callus      Plan:  Patient was evaluated and treated and all questions answered. -Examined patient -Educated on hammertoes and corns and calluses  and treatment options  -Discussed padding including toe caps and crest pads.  -Discussed need for potential surgery if pain does not improved.  -Discussed callus care and debrided as courtesy medial callus on foot and possible foregn body removed.  -Discussed treatment options for painful dystrophic nails  -Clinical picture and Fungal culture was obtained by removing a portion of the hard nail  itself from each of the involved toenails using a sterile nail nipper and sent to [facility] lab. Patient tolerated the biopsy procedure well without discomfort or need for anesthesia.  -Discussed fungal nail treatment options including oral, topical, and laser treatments.  -Patient to return in 4 weeks for follow up evaluation and discussion of fungal culture results or sooner if symptoms worsen.   Louann Sjogren, DPM

## 2023-09-03 ENCOUNTER — Other Ambulatory Visit: Payer: Self-pay | Admitting: Family Medicine

## 2023-09-03 ENCOUNTER — Encounter: Payer: Self-pay | Admitting: Family Medicine

## 2023-09-03 MED ORDER — EPINEPHRINE 0.3 MG/0.3ML IJ SOAJ: 0.3000 mg | INTRAMUSCULAR | 2 refills | Status: DC | PRN

## 2023-09-03 NOTE — Telephone Encounter (Signed)
sent 

## 2023-09-10 NOTE — Telephone Encounter (Signed)
Please schedule office visit  thanks

## 2023-09-27 NOTE — Progress Notes (Unsigned)
   Established Patient Office Visit   Subjective  Patient ID: Meghan Burton, female    DOB: 1996/06/06  Age: 27 y.o. MRN: [id number]  No chief complaint on file.   She  has a past medical history of Allergy, Closed fracture of lateral portion of left tibial plateau (11/13/2014), Pregnancy induced hypertension, Tibial plateau fracture, left, Trichimoniasis (06/20/2021), and Vision abnormalities.  HPI  ROS    Objective:     There were no vitals taken for this visit. {Vitals History (Optional):23777}  Physical Exam   No results found for any visits on 09/28/23.  The ASCVD Risk score (Arnett DK, et al., 2019) failed to calculate for the following reasons:   The 2019 ASCVD risk score is only valid for ages 69 to 24    Assessment & Plan:  There are no diagnoses linked to this encounter.  No follow-ups on file.   Cruzita Lederer Newman Nip, FNP

## 2023-09-27 NOTE — Patient Instructions (Signed)

## 2023-09-28 ENCOUNTER — Ambulatory Visit: Payer: 59 | Admitting: Family Medicine

## 2023-09-28 ENCOUNTER — Encounter: Payer: Self-pay | Admitting: Family Medicine

## 2023-09-28 VITALS — BP 122/70 | HR 92 | Ht 67.0 in | Wt 229.1 lb

## 2023-09-28 DIAGNOSIS — Z6835 Body mass index (BMI) 35.0-35.9, adult: Secondary | ICD-10-CM | POA: Diagnosis not present

## 2023-09-28 DIAGNOSIS — E66812 Obesity, class 2: Secondary | ICD-10-CM

## 2023-09-28 DIAGNOSIS — Z131 Encounter for screening for diabetes mellitus: Secondary | ICD-10-CM | POA: Diagnosis not present

## 2023-09-28 DIAGNOSIS — Z1329 Encounter for screening for other suspected endocrine disorder: Secondary | ICD-10-CM | POA: Diagnosis not present

## 2023-09-28 DIAGNOSIS — E559 Vitamin D deficiency, unspecified: Secondary | ICD-10-CM | POA: Diagnosis not present

## 2023-09-28 DIAGNOSIS — Z13220 Encounter for screening for lipoid disorders: Secondary | ICD-10-CM | POA: Diagnosis not present

## 2023-09-28 NOTE — Assessment & Plan Note (Addendum)
Trial on Phentermine  Patient goal weight 190 lbs Discussed the importance to start eating 3 meals a day including breakfast, drink 8 glasses of water a day ,reduce portion sizes. reduced carbohydrates limit saturated and trans fat, increase servings of vegetables and limit processed foods. Find an activity that you will enjoy and start to be active at least 5 days a week for 30 minutes each day. Keep a food journal or an activity journal to identify triggers that lead to emotional eating

## 2023-09-29 ENCOUNTER — Other Ambulatory Visit: Payer: Self-pay | Admitting: Family Medicine

## 2023-09-29 ENCOUNTER — Ambulatory Visit: Payer: 59 | Admitting: Podiatry

## 2023-09-29 DIAGNOSIS — E669 Obesity, unspecified: Secondary | ICD-10-CM

## 2023-09-29 DIAGNOSIS — E038 Other specified hypothyroidism: Secondary | ICD-10-CM

## 2023-09-29 MED ORDER — PHENTERMINE HCL 37.5 MG PO CAPS: 37.5000 mg | ORAL_CAPSULE | ORAL | 2 refills | Status: DC

## 2023-09-30 ENCOUNTER — Ambulatory Visit: Payer: 59

## 2023-09-30 LAB — CBC WITH DIFFERENTIAL/PLATELET
Basophils Absolute: 0 10*3/uL (ref 0.0–0.2)
Basos: 1 %
EOS (ABSOLUTE): 0.2 10*3/uL (ref 0.0–0.4)
Eos: 7 %
Hematocrit: 37.4 % (ref 34.0–46.6)
Hemoglobin: 11.5 g/dL (ref 11.1–15.9)
Immature Grans (Abs): 0 10*3/uL (ref 0.0–0.1)
Immature Granulocytes: 0 %
Lymphocytes Absolute: 0.8 10*3/uL (ref 0.7–3.1)
Lymphs: 25 %
MCH: 23.7 pg — ABNORMAL LOW (ref 26.6–33.0)
MCHC: 30.7 g/dL — ABNORMAL LOW (ref 31.5–35.7)
MCV: 77 fL — ABNORMAL LOW (ref 79–97)
Monocytes Absolute: 0.8 10*3/uL (ref 0.1–0.9)
Monocytes: 25 %
Neutrophils Absolute: 1.4 10*3/uL (ref 1.4–7.0)
Neutrophils: 42 %
Platelets: 287 10*3/uL (ref 150–450)
RBC: 4.86 x10E6/uL (ref 3.77–5.28)
RDW: 13.8 % (ref 11.7–15.4)
WBC: 3.2 10*3/uL — ABNORMAL LOW (ref 3.4–10.8)

## 2023-09-30 LAB — CMP14+EGFR
ALT: 19 [IU]/L (ref 0–32)
AST: 35 [IU]/L (ref 0–40)
Albumin: 4.2 g/dL (ref 4.0–5.0)
Alkaline Phosphatase: 81 [IU]/L (ref 44–121)
BUN/Creatinine Ratio: 7 — ABNORMAL LOW (ref 9–23)
BUN: 6 mg/dL (ref 6–20)
Bilirubin Total: 0.3 mg/dL (ref 0.0–1.2)
CO2: 22 mmol/L (ref 20–29)
Calcium: 9.1 mg/dL (ref 8.7–10.2)
Chloride: 105 mmol/L (ref 96–106)
Creatinine, Ser: 0.9 mg/dL (ref 0.57–1.00)
Globulin, Total: 2.5 g/dL (ref 1.5–4.5)
Glucose: 81 mg/dL (ref 70–99)
Potassium: 4.2 mmol/L (ref 3.5–5.2)
Sodium: 140 mmol/L (ref 134–144)
Total Protein: 6.7 g/dL (ref 6.0–8.5)
eGFR: 90 mL/min/{1.73_m2} (ref >59)

## 2023-09-30 LAB — HEMOGLOBIN A1C
Est. average glucose Bld gHb Est-mCnc: 114 mg/dL
Hgb A1c MFr Bld: 5.6 % (ref 4.8–5.6)

## 2023-09-30 LAB — TSH+FREE T4
Free T4: 1.25 ng/dL (ref 0.82–1.77)
TSH: 0.366 u[IU]/mL — ABNORMAL LOW (ref 0.450–4.500)

## 2023-09-30 LAB — MICROALBUMIN / CREATININE URINE RATIO
Creatinine, Urine: 276.4 mg/dL
Microalb/Creat Ratio: 2 mg/g{creat} (ref 0–29)
Microalbumin, Urine: 6.1 ug/mL

## 2023-09-30 LAB — LIPID PANEL
Chol/HDL Ratio: 3.2 {ratio} (ref 0.0–4.4)
Cholesterol, Total: 159 mg/dL (ref 100–199)
HDL: 49 mg/dL (ref >39)
LDL Chol Calc (NIH): 97 mg/dL (ref 0–99)
Triglycerides: 64 mg/dL (ref 0–149)
VLDL Cholesterol Cal: 13 mg/dL (ref 5–40)

## 2023-09-30 LAB — VITAMIN D 25 HYDROXY (VIT D DEFICIENCY, FRACTURES): Vit D, 25-Hydroxy: 11.5 ng/mL — ABNORMAL LOW (ref 30.0–100.0)

## 2023-12-27 NOTE — Progress Notes (Unsigned)
   Established Patient Office Visit   Subjective  Patient ID: Meghan Burton, female    DOB: 05-24-1996  Age: 28 y.o. MRN: [id number]  No chief complaint on file.   She  has a past medical history of Allergy, Closed fracture of lateral portion of left tibial plateau (11/13/2014), Pregnancy induced hypertension, Tibial plateau fracture, left, Trichimoniasis (06/20/2021), and Vision abnormalities.  HPI  ROS    Objective:     There were no vitals taken for this visit. {Vitals History (Optional):23777}  Physical Exam   No results found for any visits on 12/29/23.  The ASCVD Risk score (Arnett DK, et al., 2019) failed to calculate for the following reasons:   The 2019 ASCVD risk score is only valid for ages 30 to 38    Assessment & Plan:  There are no diagnoses linked to this encounter.  No follow-ups on file.   Cruzita Lederer Newman Nip, FNP

## 2023-12-27 NOTE — Patient Instructions (Signed)

## 2023-12-29 ENCOUNTER — Encounter: Payer: 59 | Admitting: Family Medicine

## 2023-12-29 DIAGNOSIS — E611 Iron deficiency: Secondary | ICD-10-CM

## 2023-12-29 NOTE — Progress Notes (Signed)
 No show

## 2024-01-06 ENCOUNTER — Encounter: Payer: Self-pay | Admitting: Family Medicine

## 2024-05-05 ENCOUNTER — Ambulatory Visit

## 2024-05-05 ENCOUNTER — Other Ambulatory Visit (HOSPITAL_COMMUNITY)
Admission: RE | Admit: 2024-05-05 | Discharge: 2024-05-05 | Disposition: A | Discharge status: Home / Self Care | Source: Ambulatory Visit | Attending: Obstetrics & Gynecology | Admitting: Obstetrics & Gynecology

## 2024-05-05 DIAGNOSIS — Z113 Encounter for screening for infections with a predominantly sexual mode of transmission: Secondary | ICD-10-CM | POA: Insufficient documentation

## 2024-05-05 NOTE — Progress Notes (Signed)
   NURSE VISIT- STD  SUBJECTIVE:  Yanis Juma is a 28 y.o. G1P1001 GYN patientfemale here for a vaginal swab for STD screen.  She reports the following symptoms: none for 0 days. Denies abnormal vaginal bleeding, significant pelvic pain, fever, or UTI symptoms. Patient denied any symptoms but does endorse having a new sexual partner.   OBJECTIVE:  There were no vitals taken for this visit.  Appears well, in no apparent distress  ASSESSMENT: Vaginal swab for STD screen  PLAN: Self-collected vaginal probe for Gonorrhea, Chlamydia, Trichomonas, Bacterial Vaginosis, Yeast sent to lab Treatment: to be determined once results are received Follow-up as needed if symptoms persist/worsen, or new symptoms develop  Aleck FORBES Blase  05/05/2024 8:40 AM

## 2024-05-09 ENCOUNTER — Ambulatory Visit: Payer: Self-pay | Admitting: Adult Health

## 2024-05-09 LAB — CERVICOVAGINAL ANCILLARY ONLY
Bacterial Vaginitis (gardnerella): POSITIVE — AB
Candida Glabrata: NEGATIVE
Candida Vaginitis: NEGATIVE
Chlamydia: NEGATIVE
Comment: NEGATIVE
Comment: NEGATIVE
Comment: NEGATIVE
Comment: NEGATIVE
Comment: NEGATIVE
Comment: NORMAL
Neisseria Gonorrhea: NEGATIVE
Trichomonas: NEGATIVE

## 2024-05-09 MED ORDER — METRONIDAZOLE 500 MG PO TABS: 500.0000 mg | ORAL_TABLET | Freq: Two times a day (BID) | ORAL | 0 refills | Status: DC

## 2024-07-03 ENCOUNTER — Ambulatory Visit (INDEPENDENT_AMBULATORY_CARE_PROVIDER_SITE_OTHER)

## 2024-07-03 ENCOUNTER — Ambulatory Visit (INDEPENDENT_AMBULATORY_CARE_PROVIDER_SITE_OTHER): Admitting: Podiatry

## 2024-07-03 ENCOUNTER — Encounter: Payer: Self-pay | Admitting: Podiatry

## 2024-07-03 DIAGNOSIS — M205X2 Other deformities of toe(s) (acquired), left foot: Secondary | ICD-10-CM | POA: Diagnosis not present

## 2024-07-03 DIAGNOSIS — L84 Corns and callosities: Secondary | ICD-10-CM

## 2024-07-03 NOTE — Progress Notes (Signed)
  Subjective:  Patient ID: Meghan Burton, female    DOB: 03-03-1996,   MRN: [id number]  Chief Complaint  Patient presents with   Callouses    The place she took off on my toe and the one on the bottom of my foot came back.    28 y.o. female presents for concern of  callus on fifth toe and medial arch that will occasionally hurt.   . Denies any other pedal complaints. Denies n/v/f/c.   Past Medical History:  Diagnosis Date   Allergy    seasonal   Closed fracture of lateral portion of left tibial plateau 11/13/2014   Pregnancy induced hypertension    Tibial plateau fracture, left    Trichimoniasis 06/20/2021   06/20/21 treated    Vision abnormalities    glasses    Objective:  Physical Exam: Vascular: DP/PT pulses 2/4 bilateral. CFT <3 seconds. Normal hair growth on digits. No edema.  Skin. No lacerations or abrasions bilateral feet. Bilateral hallux nails with thickened and discoloration distally. Left fifth digit corn noted dorsally and medial arch hyperkeratotic lesion with possible foreign body present Musculoskeletal: MMT 5/5 bilateral lower extremities in DF, PF, Inversion and Eversion. Deceased ROM in DF of ankle joint. Fifth digit adductovarus noted with overlying hyperkeratosis . Tender to palpation.  Neurological: Sensation intact to light touch.   Assessment:   1. Adductovarus rotation of toe, acquired, left   2. Callus      Plan:  Patient was evaluated and treated and all questions answered. -Examined patient -X-rays reivewed and hammered digits 3 and 5 on left. Adductovarus noted of fifth digit.  No acute fractures of dislocations noted.  -Educated on hammertoes and corns and calluses  and treatment options  -Discussed padding including toe caps and crest pads.  -Discussed need for potential surgery  -Discussed callus care and debrided as courtesy medial callus on foot  -Patient would like to consider surgery for her left fifth toe as it continues to be a  painful problem for her. Discussed fifth digit de-rotational arthroplasty and left third digit flexor tenotomy.  -Informed surgical risk consent was reviewed and read aloud to the patient.  I reviewed the films.  I have discussed my findings with the patient in great detail.  I have discussed all risks including but not limited to infection, stiffness, scarring, limp, disability, deformity, damage to blood vessels and nerves, numbness, poor healing, need for braces, arthritis, chronic pain, amputation, death.  All benefits and realistic expectations discussed in great detail.  I have made no promises as to the outcome.  I have provided realistic expectations.  I have offered the patient a 2nd opinion, which they have declined and assured me they preferred to proceed despite the risks. Plan for surgery 9/9  Post-op meds: Zofran , oxycodone  5/325    Asberry Failing, DPM

## 2024-07-04 ENCOUNTER — Telehealth: Payer: Self-pay | Admitting: Podiatry

## 2024-07-04 NOTE — Telephone Encounter (Signed)
 DOS- 07/18/2024  3RD/5TH HAMMERTOE REPAIR LT + LOCAL TOE BLOCK- 28285  [employer] EFFECTIVE DATE- 11/09/2022  DEDUCTIBLE- $2000 REMAINING- $1935.51 OOP- $6500 REMAINING- $3564.48 COINSURANCE- 30%/$0 COPAY  PER [employer] WEBSITE, NO PRIOR AUTH IS REQUIRED FOR CPT CODE 71714. DOCUMENTATION ATTACHED TO SURGERY CONSENT PACKET.

## 2024-07-11 ENCOUNTER — Ambulatory Visit: Admitting: Podiatry

## 2024-07-17 ENCOUNTER — Telehealth: Payer: Self-pay | Admitting: Podiatry

## 2024-07-17 NOTE — Telephone Encounter (Signed)
 Received call from Montie at the surgery center that pt wanted to cxl surgery for 07/18/24 due to cost.   I called pt and she is canceling the surgery and will call to r/s

## 2024-07-25 ENCOUNTER — Encounter: Admitting: Podiatry

## 2024-08-08 ENCOUNTER — Encounter: Admitting: Podiatry

## 2024-11-29 ENCOUNTER — Ambulatory Visit

## 2024-11-30 ENCOUNTER — Encounter: Payer: Self-pay | Admitting: Adult Health

## 2024-11-30 ENCOUNTER — Ambulatory Visit: Admitting: Adult Health

## 2024-11-30 VITALS — BP 121/79 | HR 77 | Ht 67.0 in | Wt 196.5 lb

## 2024-11-30 DIAGNOSIS — Z713 Dietary counseling and surveillance: Secondary | ICD-10-CM | POA: Diagnosis not present

## 2024-11-30 DIAGNOSIS — Z6830 Body mass index (BMI) 30.0-30.9, adult: Secondary | ICD-10-CM

## 2024-11-30 MED ORDER — PHENTERMINE HCL 37.5 MG PO TABS: 37.5000 mg | ORAL_TABLET | Freq: Every day | ORAL | 0 refills | Status: AC

## 2024-11-30 NOTE — Progress Notes (Signed)
" °  Subjective:     Patient ID: Meghan Burton, female   DOB: 21-Jul-1996, 29 y.o.   MRN: [id number]  HPI Meghan Burton is a 29 year old black female, single, G1P1001, in to discuss getting on phentermine  to help with weight loss. Has used in the past.    Component Value Date/Time   DIAGPAP  08/13/2021 1107    - Negative for intraepithelial lesion or malignancy (NILM)   DIAGPAP  07/05/2018 0000    NEGATIVE FOR INTRAEPITHELIAL LESIONS OR MALIGNANCY.   ADEQPAP  08/13/2021 1107    Satisfactory for evaluation; transformation zone component PRESENT.   ADEQPAP  07/05/2018 0000    Satisfactory for evaluation  endocervical/transformation zone component PRESENT.    PCP is I Polanco NP Review of Systems Can't lose weight Reviewed past medical,surgical, social and family history. Reviewed medications and allergies.     Objective:   Physical Exam BP 121/79 (BP Location: Right Arm, Patient Position: Sitting, Cuff Size: Normal)   Pulse 77   Ht 5' 7 (1.702 m)   Wt 196 lb 8 oz (89.1 kg)   LMP 11/16/2024 (Exact Date)   BMI 30.78 kg/m     Skin warm and dry.  Lungs: clear to ausculation bilaterally. Cardiovascular: regular rate and rhythm.  Fall risk is low  Upstream - 11/30/24 1103       Pregnancy Intention Screening   Does the patient want to become pregnant in the next year? No    Does the patient's partner want to become pregnant in the next year? No    Would the patient like to discuss contraceptive options today? No      Contraception Wrap Up   Current Method Female Condom    End Method Female Condom          Assessment:     1. Weight loss counseling, encounter for (Primary) Discussed exercising 30 minutes 5/7 days  Decrease carbs and sweets and portion size Will rx phentermine  37.5 mg 1 daily Meds ordered this encounter  Medications   phentermine  (ADIPEX-P ) 37.5 MG tablet    Sig: Take 1 tablet (37.5 mg total) by mouth daily before breakfast. Take 1 daily    Dispense:  30 tablet     Refill:  0    Supervising Provider:   JAYNE MINDER H [[id number]]   Follow up in 4 weeks for weight and BP Check   2. Body mass index 30.0-30.9, adult     Plan:     Return 12/26/24 for pap and physical and weight ck     "

## 2024-12-26 ENCOUNTER — Ambulatory Visit: Admitting: Adult Health
